# Patient Record
Sex: Female | Born: 1960 | Race: White | Hispanic: No | Smoking: Never smoker
Health system: Southern US, Community
[De-identification: ages and names within clinical notes are randomized; demographics above are authoritative.]

## PROBLEM LIST (undated history)

## (undated) DIAGNOSIS — T7840XA Allergy, unspecified, initial encounter: Secondary | ICD-10-CM

## (undated) DIAGNOSIS — G43909 Migraine, unspecified, not intractable, without status migrainosus: Secondary | ICD-10-CM

## (undated) DIAGNOSIS — M1990 Unspecified osteoarthritis, unspecified site: Secondary | ICD-10-CM

## (undated) DIAGNOSIS — E785 Hyperlipidemia, unspecified: Secondary | ICD-10-CM

## (undated) HISTORY — PX: NO PAST SURGERIES: SHX2092

## (undated) HISTORY — DX: Hyperlipidemia, unspecified: E78.5

## (undated) HISTORY — DX: Unspecified osteoarthritis, unspecified site: M19.90

## (undated) HISTORY — DX: Migraine, unspecified, not intractable, without status migrainosus: G43.909

## (undated) HISTORY — DX: Allergy, unspecified, initial encounter: T78.40XA

---

## 1988-10-31 HISTORY — PX: LAPAROSCOPIC ABDOMINAL EXPLORATION: SHX6249

## 1999-06-03 ENCOUNTER — Other Ambulatory Visit: Admission: RE | Admit: 1999-06-03 | Discharge: 1999-06-03 | Payer: Self-pay | Admitting: Gynecology

## 2000-09-04 ENCOUNTER — Other Ambulatory Visit: Admission: RE | Admit: 2000-09-04 | Discharge: 2000-09-04 | Payer: Self-pay | Admitting: Gynecology

## 2000-09-08 ENCOUNTER — Encounter: Admission: RE | Admit: 2000-09-08 | Discharge: 2000-09-08 | Payer: Self-pay | Admitting: Gynecology

## 2000-09-08 ENCOUNTER — Encounter: Payer: Self-pay | Admitting: Gynecology

## 2001-05-11 ENCOUNTER — Encounter: Payer: Self-pay | Admitting: Gynecology

## 2001-05-11 ENCOUNTER — Encounter: Admission: RE | Admit: 2001-05-11 | Discharge: 2001-05-11 | Payer: Self-pay | Admitting: Gynecology

## 2001-09-21 ENCOUNTER — Other Ambulatory Visit: Admission: RE | Admit: 2001-09-21 | Discharge: 2001-09-21 | Payer: Self-pay | Admitting: Gynecology

## 2002-06-04 ENCOUNTER — Encounter: Admission: RE | Admit: 2002-06-04 | Discharge: 2002-06-04 | Payer: Self-pay | Admitting: Gynecology

## 2002-06-04 ENCOUNTER — Encounter: Payer: Self-pay | Admitting: Gynecology

## 2002-09-23 ENCOUNTER — Other Ambulatory Visit: Admission: RE | Admit: 2002-09-23 | Discharge: 2002-09-23 | Payer: Self-pay | Admitting: Gynecology

## 2003-09-29 ENCOUNTER — Other Ambulatory Visit: Admission: RE | Admit: 2003-09-29 | Discharge: 2003-09-29 | Payer: Self-pay | Admitting: Gynecology

## 2004-10-15 ENCOUNTER — Other Ambulatory Visit: Admission: RE | Admit: 2004-10-15 | Discharge: 2004-10-15 | Payer: Self-pay | Admitting: Gynecology

## 2005-10-21 ENCOUNTER — Other Ambulatory Visit: Admission: RE | Admit: 2005-10-21 | Discharge: 2005-10-21 | Payer: Self-pay | Admitting: Gynecology

## 2006-11-10 ENCOUNTER — Other Ambulatory Visit: Admission: RE | Admit: 2006-11-10 | Discharge: 2006-11-10 | Payer: Self-pay | Admitting: Internal Medicine

## 2008-05-07 ENCOUNTER — Other Ambulatory Visit: Admission: RE | Admit: 2008-05-07 | Discharge: 2008-05-07 | Payer: Self-pay | Admitting: Internal Medicine

## 2008-05-12 ENCOUNTER — Ambulatory Visit (HOSPITAL_COMMUNITY): Admission: RE | Admit: 2008-05-12 | Discharge: 2008-05-12 | Payer: Self-pay | Admitting: Internal Medicine

## 2010-07-13 ENCOUNTER — Other Ambulatory Visit: Admission: RE | Admit: 2010-07-13 | Discharge: 2010-07-13 | Payer: Self-pay | Admitting: Internal Medicine

## 2010-09-09 ENCOUNTER — Encounter: Admission: RE | Admit: 2010-09-09 | Discharge: 2010-09-09 | Payer: Self-pay | Admitting: Internal Medicine

## 2010-11-21 ENCOUNTER — Encounter: Payer: Self-pay | Admitting: Internal Medicine

## 2012-08-20 ENCOUNTER — Other Ambulatory Visit: Payer: Self-pay | Admitting: Internal Medicine

## 2012-08-20 DIAGNOSIS — N6310 Unspecified lump in the right breast, unspecified quadrant: Secondary | ICD-10-CM

## 2012-08-23 ENCOUNTER — Ambulatory Visit
Admission: RE | Admit: 2012-08-23 | Discharge: 2012-08-23 | Disposition: A | Discharge status: Home / Self Care | Payer: 59 | Source: Ambulatory Visit | Attending: Internal Medicine | Admitting: Internal Medicine

## 2012-08-23 DIAGNOSIS — N6310 Unspecified lump in the right breast, unspecified quadrant: Secondary | ICD-10-CM

## 2013-08-06 ENCOUNTER — Other Ambulatory Visit: Payer: Self-pay

## 2013-08-06 DIAGNOSIS — Z1231 Encounter for screening mammogram for malignant neoplasm of breast: Secondary | ICD-10-CM

## 2013-08-26 ENCOUNTER — Ambulatory Visit
Admission: RE | Admit: 2013-08-26 | Discharge: 2013-08-26 | Disposition: A | Discharge status: Home / Self Care | Payer: 59 | Source: Ambulatory Visit

## 2013-08-26 DIAGNOSIS — Z1231 Encounter for screening mammogram for malignant neoplasm of breast: Secondary | ICD-10-CM

## 2013-08-28 ENCOUNTER — Other Ambulatory Visit: Payer: Self-pay | Admitting: Emergency Medicine

## 2013-08-28 ENCOUNTER — Other Ambulatory Visit (HOSPITAL_COMMUNITY)
Admission: RE | Admit: 2013-08-28 | Discharge: 2013-08-28 | Disposition: A | Discharge status: Home / Self Care | Payer: 59 | Source: Ambulatory Visit | Attending: Internal Medicine | Admitting: Internal Medicine

## 2013-08-28 DIAGNOSIS — Z01419 Encounter for gynecological examination (general) (routine) without abnormal findings: Secondary | ICD-10-CM | POA: Insufficient documentation

## 2013-09-04 ENCOUNTER — Telehealth: Payer: Self-pay | Admitting: Physician Assistant

## 2013-09-04 MED ORDER — METHYLPREDNISOLONE 4 MG PO KIT: PACK | ORAL | Status: DC

## 2013-09-04 NOTE — Telephone Encounter (Signed)
Pt hasnt heard anything about her oxygen order. Still having headaches. Wants to see if she can get a rx of prednisone to break the cycle.

## 2013-09-04 NOTE — Telephone Encounter (Signed)
Sent medrol dose pack to [facility]. Will send to Lupita Leash to contact [employer] to check on oxygen status.

## 2013-09-25 ENCOUNTER — Ambulatory Visit: Payer: Self-pay | Admitting: Physician Assistant

## 2013-10-03 ENCOUNTER — Telehealth: Payer: Self-pay

## 2013-10-03 ENCOUNTER — Telehealth: Payer: Self-pay | Admitting: Internal Medicine

## 2013-10-03 NOTE — Telephone Encounter (Signed)
Return call to patient advised her to d/c oxygen if not helping and per Marchelle Folks we can send her to a Neurologist or a Headache clinic , patient states that she has tried that and states that she heard botox might help, I advised her to call her insurance and see if it was covered and to advise where they would send patient for treatment

## 2013-10-03 NOTE — Telephone Encounter (Signed)
Pt said she used three tanks of oxygen for the migraines and it is not working. Please advise what to do now.

## 2013-10-03 NOTE — Telephone Encounter (Signed)
LMOM for patient regarding home sleep study result (ok to leave messages per patient registration form), results were normal

## 2013-10-10 ENCOUNTER — Encounter: Payer: Self-pay | Admitting: Internal Medicine

## 2013-10-10 DIAGNOSIS — E785 Hyperlipidemia, unspecified: Secondary | ICD-10-CM | POA: Insufficient documentation

## 2013-10-10 DIAGNOSIS — M1990 Unspecified osteoarthritis, unspecified site: Secondary | ICD-10-CM | POA: Insufficient documentation

## 2013-10-10 DIAGNOSIS — G43909 Migraine, unspecified, not intractable, without status migrainosus: Secondary | ICD-10-CM | POA: Insufficient documentation

## 2013-10-10 DIAGNOSIS — T7840XA Allergy, unspecified, initial encounter: Secondary | ICD-10-CM | POA: Insufficient documentation

## 2013-10-11 ENCOUNTER — Ambulatory Visit (INDEPENDENT_AMBULATORY_CARE_PROVIDER_SITE_OTHER): Payer: 59 | Admitting: Physician Assistant

## 2013-10-11 ENCOUNTER — Encounter: Payer: Self-pay | Admitting: Physician Assistant

## 2013-10-11 VITALS — BP 102/68 | HR 76 | Temp 98.1°F | Resp 16 | Ht 63.5 in | Wt 111.0 lb

## 2013-10-11 DIAGNOSIS — IMO0001 Reserved for inherently not codable concepts without codable children: Secondary | ICD-10-CM

## 2013-10-11 DIAGNOSIS — G43909 Migraine, unspecified, not intractable, without status migrainosus: Secondary | ICD-10-CM

## 2013-10-11 DIAGNOSIS — N3000 Acute cystitis without hematuria: Secondary | ICD-10-CM

## 2013-10-11 DIAGNOSIS — E785 Hyperlipidemia, unspecified: Secondary | ICD-10-CM

## 2013-10-11 DIAGNOSIS — M791 Myalgia, unspecified site: Secondary | ICD-10-CM

## 2013-10-11 DIAGNOSIS — E782 Mixed hyperlipidemia: Secondary | ICD-10-CM

## 2013-10-11 LAB — CK: Total CK: 99 U/L (ref 7–177)

## 2013-10-11 LAB — HEPATIC FUNCTION PANEL
ALT: 14 U/L (ref 0–35)
AST: 18 U/L (ref 0–37)
Bilirubin, Direct: 0.1 mg/dL (ref 0.0–0.3)
Indirect Bilirubin: 0.3 mg/dL (ref 0.0–0.9)

## 2013-10-11 LAB — LIPID PANEL
LDL Cholesterol: 94 mg/dL (ref 0–99)
VLDL: 20 mg/dL (ref 0–40)

## 2013-10-11 MED ORDER — METHAZOLAMIDE 50 MG PO TABS: 50.0000 mg | ORAL_TABLET | Freq: Three times a day (TID) | ORAL | Status: DC

## 2013-10-11 MED ORDER — CARBAMAZEPINE 100 MG PO CHEW: 100.0000 mg | CHEWABLE_TABLET | Freq: Two times a day (BID) | ORAL | Status: DC

## 2013-10-11 NOTE — Patient Instructions (Signed)
Migraine Headache A migraine headache is an intense, throbbing pain on one or both sides of your head. A migraine can last for 30 minutes to several hours. CAUSES  The exact cause of a migraine headache is not always known. However, a migraine may be caused when nerves in the brain become irritated and release chemicals that cause inflammation. This causes pain. SYMPTOMS  Pain on one or both sides of your head.  Pulsating or throbbing pain.  Severe pain that prevents daily activities.  Pain that is aggravated by any physical activity.  Nausea, vomiting, or both.  Dizziness.  Pain with exposure to bright lights, loud noises, or activity.  General sensitivity to bright lights, loud noises, or smells. Before you get a migraine, you may get warning signs that a migraine is coming (aura). An aura may include:  Seeing flashing lights.  Seeing bright spots, halos, or zig-zag lines.  Having tunnel vision or blurred vision.  Having feelings of numbness or tingling.  Having trouble talking.  Having muscle weakness. MIGRAINE TRIGGERS  Alcohol.  Smoking.  Stress.  Menstruation.  Aged cheeses.  Foods or drinks that contain nitrates, glutamate, aspartame, or tyramine.  Lack of sleep.  Chocolate.  Caffeine.  Hunger.  Physical exertion.  Fatigue.  Medicines used to treat chest pain (nitroglycerine), birth control pills, estrogen, and some blood pressure medicines. DIAGNOSIS  A migraine headache is often diagnosed based on:  Symptoms.  Physical examination.  A CT scan or MRI of your head. TREATMENT Medicines may be given for pain and nausea. Medicines can also be given to help prevent recurrent migraines.  HOME CARE INSTRUCTIONS  Only take over-the-counter or prescription medicines for pain or discomfort as directed by your caregiver. The use of long-term narcotics is not recommended.  Lie down in a dark, quiet room when you have a migraine.  Keep a journal  to find out what may trigger your migraine headaches. For example, write down:  What you eat and drink.  How much sleep you get.  Any change to your diet or medicines.  Limit alcohol consumption.  Quit smoking if you smoke.  Get 7 to 9 hours of sleep, or as recommended by your caregiver.  Limit stress.  Keep lights dim if bright lights bother you and make your migraines worse. SEEK IMMEDIATE MEDICAL CARE IF:   Your migraine becomes severe.  You have a fever.  You have a stiff neck.  You have vision loss.  You have muscular weakness or loss of muscle control.  You start losing your balance or have trouble walking.  You feel faint or pass out.  You have severe symptoms that are different from your first symptoms. MAKE SURE YOU:   Understand these instructions.  Will watch your condition.  Will get help right away if you are not doing well or get worse. Document Released: 10/17/2005 Document Revised: 01/09/2012 Document Reviewed: 10/07/2011 [employer] Patient Information 2014 [employer].  

## 2013-10-11 NOTE — Progress Notes (Signed)
HPI Patient presents for a one month follow up. She has been plaqued with migraines for a long time and have failed several medications and conservative treatment. Most recently we tried her on Oxygen for possible cluster headaches but this did not help. She had a negative sleep study. She continues to have pain. She has seen neurologist and the headache clinic, stating the only medication to help her has been Diamox which is no longer affective. Allergy testing showed she was allergic to cats, she is on claritin D.   Past Medical History  Diagnosis Date  . Allergy   . Hyperlipidemia   . Arthritis   . Migraines      No Known Allergies    Current Outpatient Prescriptions on File Prior to Visit  Medication Sig Dispense Refill  . atorvastatin (LIPITOR) 40 MG tablet Take 40 mg by mouth daily.      . frovatriptan (FROVA) 2.5 MG tablet Take 2.5 mg by mouth as needed for migraine (No more than 2 tablets in 24 hrs). If recurs, may repeat after 2 hours. Max of 3 tabs in 24 hours.      . methylPREDNISolone (MEDROL DOSEPAK) 4 MG tablet follow package directions  21 tablet  0   No current facility-administered medications on file prior to visit.    ROS: all negative expect above.   Physical: Filed Weights   10/11/13 0900  Weight: 111 lb (50.349 kg)   Filed Vitals:   10/11/13 0900  BP: 102/68  Pulse: 76  Temp: 98.1 F (36.7 C)  Resp: 16   General Appearance: Well nourished, in no apparent distress. Eyes: PERRLA, EOMs. Sinuses: No Frontal/maxillary tenderness ENT/Mouth: Ext aud canals clear, normal light reflex with TMs without erythema, bulging. Post pharynx without erythema, swelling, exudate.  Respiratory: CTAB Cardio: RRR, no murmurs, rubs or gallops. Peripheral pulses brisk and equal bilaterally, without edema. No aortic or femoral bruits. Abdomen: Flat, soft, with bowl sounds. Nontender, no guarding, rebound. Lymphatics: Non tender without lymphadenopathy.  Musculoskeletal: Full  ROM all peripheral extremities, 5/5 strength, and normal gait. Skin: Warm, dry without rashes, lesions, ecchymosis.  Neuro: Cranial nerves intact, reflexes equal bilaterally. Normal muscle tone, no cerebellar symptoms. Sensation intact.  Pysch: Awake and oriented X 3, normal affect, Insight and Judgment appropriate.   Assessment and Plan: Migraines-  Xyzal 5 for possible allergies Methazolamide 50mg  1-2 tablets daily for migraine.  Tegretol 100mg  BID   folow up in one month.

## 2013-10-12 LAB — URINALYSIS, ROUTINE W REFLEX MICROSCOPIC
Leukocytes, UA: NEGATIVE
Protein, ur: NEGATIVE mg/dL
Urobilinogen, UA: 0.2 mg/dL (ref 0.0–1.0)

## 2013-10-13 LAB — URINE CULTURE: Colony Count: 9000

## 2013-10-21 ENCOUNTER — Other Ambulatory Visit: Payer: Self-pay | Admitting: Physician Assistant

## 2013-10-21 MED ORDER — EZETIMIBE 10 MG PO TABS: 10.0000 mg | ORAL_TABLET | Freq: Every day | ORAL | Status: DC

## 2013-12-25 ENCOUNTER — Ambulatory Visit: Payer: Self-pay | Admitting: Physician Assistant

## 2013-12-31 ENCOUNTER — Ambulatory Visit (INDEPENDENT_AMBULATORY_CARE_PROVIDER_SITE_OTHER): Payer: 59 | Admitting: Physician Assistant

## 2013-12-31 ENCOUNTER — Encounter: Payer: Self-pay | Admitting: Physician Assistant

## 2013-12-31 VITALS — BP 102/62 | HR 76 | Temp 98.1°F | Resp 16 | Ht 63.5 in | Wt 111.0 lb

## 2013-12-31 DIAGNOSIS — G43909 Migraine, unspecified, not intractable, without status migrainosus: Secondary | ICD-10-CM

## 2013-12-31 DIAGNOSIS — J309 Allergic rhinitis, unspecified: Secondary | ICD-10-CM

## 2013-12-31 DIAGNOSIS — M7520 Bicipital tendinitis, unspecified shoulder: Secondary | ICD-10-CM

## 2013-12-31 DIAGNOSIS — J302 Other seasonal allergic rhinitis: Secondary | ICD-10-CM

## 2013-12-31 DIAGNOSIS — M7522 Bicipital tendinitis, left shoulder: Secondary | ICD-10-CM

## 2013-12-31 MED ORDER — FLUTICASONE PROPIONATE 50 MCG/ACT NA SUSP: 1.0000 | Freq: Every day | NASAL | Status: DC

## 2013-12-31 MED ORDER — PREDNISONE 20 MG PO TABS: ORAL_TABLET | ORAL | Status: DC

## 2013-12-31 NOTE — Patient Instructions (Signed)
Biceps Tendon Tendinitis (Proximal) and Tenosynovitis with Rehab Tendonitis and tenosynovitis involve inflammation of the tendon and the tendon lining (sheath). The proximal biceps tendon is vulnerable to tendonitis and tenosynovitis, which causes pain and discomfort in the front of the shoulder and upper arm. The tendon lining secretes a fluid that helps lubricate the tendon, allowing for proper function without pain. When the tendon and its lining become inflamed, the tendon can no longer glide smoothly, causing pain. The proximal biceps tendon connects the biceps muscle to two bones of the shoulder. It is important for proper function of the elbow and turning the palm upward (supination) using the wrist. Proximal biceps tendon tendinitis may include a grade 1 or 2 strain of the tendon. Grade 1 strains involve a slight pull of the tendon without signs of tearing and no observed tendon lengthening. There is also no loss of strength. Grade 2 strains involve small tears in the tendon fibers. The tendon or muscle is stretched and strength is usually decreased.  SYMPTOMS   Pain, tenderness, swelling, warmth, or redness over the front of the shoulder.  Pain that gets worse with shoulder and elbow use, especially against resistance.  Limited motion of the shoulder or elbow.  Crackling sound (crepitation) when the tendon or shoulder is moved or touched. CAUSES  The symptoms of biceps tendonitis are due to inflammation of the tendon. Inflammation may be caused by:  Strain from sudden increase in amount or intensity of activity.  Direct blow or injury to the elbow (uncommon).  Overuse or repetitive elbow bending or wrist rotation, particularly when turning the palm up, or with elbow hyperextension. RISK INCREASES WITH:  Sports that involve contact or overhead arm activity (throwing sports, gymnastics, weightlifting, bodybuilding, rock climbing).  Heavy labor.  Poor strength and  flexibility.  Failure to warm up properly before activity. PREVENTION  Warm up and stretch properly before activity.  Allow time for recovery between activities.  Maintain physical fitness:  Strength, flexibility, and endurance.  Cardiovascular fitness.  Learn and use proper exercise technique. PROGNOSIS  With proper treatment, proximal biceps tendon tendonitis and tenosynovitis is usually curable within 6 weeks. Healing is usually quicker if the cause was a direct blow, not overuse.  RELATED COMPLICATIONS   Longer healing time if not properly treated or if not given enough time to heal.  Chronically inflamed tendon that causes persistent pain with activity, that may progress to constant pain and potentially rupture of the tendon.  Recurring symptoms, especially if activity is resumed too soon or with overuse, a direct blow, or use of poor exercise technique. TREATMENT Treatment first involves ice and medicine, to reduce pain and inflammation. It is helpful to modify activities that cause pain, to reduce the chances of causing the condition to get worse. Strengthening and stretching exercises should be performed to promote proper use of the muscles of the shoulder. These exercises may be performed at home or with a therapist. Other treatments may be given such as ultrasound or heat therapy. A corticosteroid injection may be recommended to help reduce inflammation of the tendon lining. Surgery is usually not necessary. Sometimes, if symptoms last for greater than 6 months, surgery will be advised to detach the tendon and re-insert it into the arm bone. Surgery to correct other shoulder problems that may be contributing to tendinitis may be advised before surgery for the tendinitis itself.  MEDICATION  If pain medicine is needed, nonsteroidal anti-inflammatory medicines (aspirin and ibuprofen), or other minor pain relievers (  acetaminophen), are often advised.  Do not take pain medicine  for 7 days before surgery.  Prescription pain relievers may be given if your caregiver thinks they are needed. Use only as directed and only as much as you need.  Corticosteroid injections may be given. These injections should only be used on the most severe cases, as one can only receive a limited number of them. HEAT AND COLD   Cold treatment (icing) should be applied for 10 to 15 minutes every 2 to 3 hours for inflammation and pain, and immediately after activity that aggravates your symptoms. Use ice packs or an ice massage.  Heat treatment may be used before performing stretching and strengthening activities prescribed by your caregiver, physical therapist, or athletic trainer. Use a heat pack or a warm water soak. SEEK MEDICAL CARE IF:   Symptoms get worse or do not improve in 2 weeks, despite treatment.  New, unexplained symptoms develop. (Drugs used in treatment may produce side effects.) EXERCISES RANGE OF MOTION (ROM) AND EXERCISES - Biceps Tendon (Proximal) and Tenosynovitis These exercises may help you when beginning to rehabilitate your injury. Your symptoms may go away with or without further involvement from your physician, physical therapist, or athletic trainer. While completing these exercises, remember:   Restoring tissue flexibility helps normal motion to return to the joints. This allows healthier, less painful movement and activity.  An effective stretch should be held for at least 30 seconds.  A stretch should never be painful. You should only feel a gentle lengthening or release in the stretched tissue. STRETCH  Flexion, Standing  Stand with good posture. With an underhand grip on your right / left hand and an overhand grip on the opposite hand, grasp a broomstick or cane so that your hands are a little more than shoulder width apart.  Keeping your right / left elbow straight and shoulder muscles relaxed, push the stick with your opposite hand to raise your right /  left arm in front of your body and then overhead. Raise your arm until you feel a stretch in your right / left shoulder, but before you have increased shoulder pain.  Try to avoid shrugging your right / left shoulder as your arm rises, by keeping your shoulder blade tucked down and toward your mid-back spine. Hold for __________ seconds.  Slowly return to the starting position. Repeat __________ times. Complete this exercise __________ times per day. STRETCH  Abduction, Supine  Lie on your back. With an underhand grip on your right / left hand and an overhand grip on the opposite hand, grasp a broomstick or cane so that your hands are a little more than shoulder width apart.  Keeping your right / left elbow straight and shoulder muscles relaxed, push the stick with your opposite hand to raise your right / left arm out to the side of your body and then overhead. Raise your arm until you feel a stretch in your right / left shoulder, but before you have increased shoulder pain.  Try to avoid shrugging your right / left shoulder as your arm rises, by keeping your shoulder blade tucked down and toward your mid-back spine. Hold for __________ seconds.  Slowly return to the starting position. Repeat __________ times. Complete this exercise __________ times per day. ROM  Flexion, Active-Assisted  Lie on your back. You may bend your knees for comfort.  Grasp a broomstick or cane so your hands are about shoulder width apart. Your right / left hand should   grip the end of the stick so that your hand is positioned "thumbs-up," as if you were about to shake hands.  Using your healthy arm to lead, raise your right / left arm overhead until you feel a gentle stretch in your shoulder. Hold for __________ seconds.  Use the stick to assist in returning your right / left arm to its starting position. Repeat __________ times. Complete this exercise __________ times per day.  STRETCH  Flexion, Standing   Stand  facing a wall. Walk your right / left fingers up the wall until you feel a moderate stretch in your shoulder. As your hand gets higher, you may need to step closer to the wall or use a door frame to walk through.  Try to avoid shrugging your right / left shoulder as your arm rises, by keeping your shoulder blade tucked down and toward your mid-back spine.  Hold for __________ seconds. Use your other hand, if needed, to ease out of the stretch and return to the starting position. Repeat __________ times. Complete this exercise __________ times per day.  ROM - Internal Rotation   Using underhand grips, grasp a stick behind your back with both hands.  While standing upright with good posture, slide the stick up your back until you feel a mild stretch in the front of your shoulder.  Hold for __________ seconds. Slowly return to your starting position. Repeat __________ times. Complete this exercise __________ times per day.  STRETCH - Internal Rotation  Place your right / left hand behind your back, palm-up.  Throw a towel or belt over your opposite shoulder. Grasp the towel with your right / left hand.  While keeping an upright posture, gently pull up on the towel until you feel a stretch in the front of your right / left shoulder.  Avoid shrugging your right / left shoulder as your arm rises, by keeping your shoulder blade tucked down and toward your mid-back spine.  Hold for __________ seconds. Release the stretch by lowering your opposite hand. Repeat __________ times. Complete this exercise __________ times per day. STRENGTHENING EXERCISES - Biceps Tendon Tendinitis (Proximal) and Tenosynovitis These exercises may help you regain your strength after your physician has discontinued your restraint in a cast or brace. They may resolve your symptoms with or without further involvement from your physician, physical therapist or athletic trainer. While completing these exercises, remember:    Muscles can gain both the endurance and the strength needed for everyday activities through controlled exercises.  Complete these exercises as instructed by your physician, physical therapist or athletic trainer. Increase the resistance and repetitions only as guided.  You may experience muscle soreness or fatigue, but the pain or discomfort you are trying to eliminate should never worsen during these exercises. If this pain does get worse, stop and make sure you are following the directions exactly. If the pain is still present after adjustments, discontinue the exercise until you can discuss the trouble with your caregiver. STRENGTH - Elbow Flexors, Isometric  Stand or sit upright on a firm surface. Place your right / left arm so that your hand is palm-up and at the height of your waist.  Place your opposite hand on top of your forearm. Gently push down as your right / left arm resists. Push as hard as you can with both arms, without causing any pain or movement at your right / left elbow. Hold this stationary position for __________ seconds.  Gradually release the tension in both   arms. Allow your muscles to relax completely before repeating. Repeat __________ times. Complete this exercise __________ times per day. STRENGTH - Shoulder Flexion, Isometric  With good posture and facing a wall, stand or sit about 4-6 inches away.  Keeping your right / left elbow straight, gently press the top of your fist into the wall. Increase the pressure gradually until you are pressing as hard as you can, without shrugging your shoulder or increasing any shoulder discomfort.  Hold for __________ seconds.  Release the tension slowly. Relax your shoulder muscles completely before you start the next repetition. Repeat __________ times. Complete this exercise __________ times per day.  STRENGTH  Elbow Flexors, Supinated  With good posture, stand or sit on a firm chair without armrests. Allow your right /  left arm to rest at your side with your palm facing forward.  Holding a __________ weight, or gripping a rubber exercise band or tubing,  bring your hand toward your shoulder.  Allow your muscles to control the resistance as your hand returns to your side. Repeat __________ times. Complete this exercise __________ times per day.  STRENGTH - Shoulder Flexion  Stand or sit with good posture. Grasp a __________ weight, or an exercise band or tubing, so that your hand is "thumbs-up," like when you shake hands.  Slowly lift your right / left arm as far as you can, without increasing any shoulder pain. At first, many people can only raise their hand to shoulder height.  Avoid shrugging your right / left shoulder as your arm rises, by keeping your shoulder blade tucked down and toward your mid-back spine.  Hold for __________ seconds. Control the descent of your hand as you slowly return to your starting position. Repeat __________ times. Complete this exercise __________ times per day. Document Released: 10/17/2005 Document Revised: 01/09/2012 Document Reviewed: 01/29/2009 [employer] Patient Information 2014 [employer].  

## 2013-12-31 NOTE — Progress Notes (Signed)
Subjective:    Patient ID: Heidi Stone, female    DOB: 1961/10/02, 53 y.o.   MRN: [id number]  HPI 53 y.o. female presents for follow up of migraines. She has been plaqued with migraines since school age and was diagnosed in her 65's. She states they are usually throbbing pain in her eyes, denies aura, then very photosensitive, visual blurring,  And vomiting. 20 years ago Dr. Earley Favor at the headache clinic diagnosed her with barometric migraines and gave her Diamox which has helped up until 2-3 years ago. Within the last year she states they are now also occipital pain, very tender to touch and states it feels like a sinus headache. She has failed several medications and conservative treatment. Allergy testing only showed cat allergy, she has had a negative MRI 12/01/2011 by Dr. 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 Most recently we tried her on Oxygen for possible cluster headaches but this did not help. She had a negative sleep study. She has seen neurologist currently and the headache clinic 20 years ago.    At this point we have exhausted preventative medications such as CCB, BB, topamax, tegretol etc, she had migraines 13 days in Jan, and 8 in Feb and causes her to miss work and affects her quality of life. She is interested in Botox or noninvasive devices such as transcranial magnestic stimulator.    Complain of left shoulder pain for past month, has been lifting weights which is new. Worse with trying to put on a coat or lifting things, left front shoulder. No weakness/numbness tingling.   Cholesterol on Zetia and tolerating well.   Review of Systems  all negative expect above.    Past Medical History  Diagnosis Date  . Allergy   . Hyperlipidemia   . Arthritis   . Migraines      No Known Allergies    Current Outpatient Prescriptions on File Prior to Visit  Medication Sig Dispense Refill  . ezetimibe  (ZETIA) 10 MG tablet Take 1 tablet (10 mg total) by mouth daily.  30 tablet  11  . frovatriptan (FROVA) 2.5 MG tablet Take 2.5 mg by mouth as needed for migraine (No more than 2 tablets in 24 hrs). If recurs, may repeat after 2 hours. Max of 3 tabs in 24 hours.      . methazolamide (NEPTAZANE) 50 MG tablet Take 1 tablet (50 mg total) by mouth 3 (three) times daily.  60 tablet  1  . methylPREDNISolone (MEDROL DOSEPAK) 4 MG tablet follow package directions  21 tablet  0  . rizatriptan (MAXALT) 5 MG tablet Take 5 mg by mouth as needed for migraine. May repeat in 2 hours if needed       No current facility-administered medications on file prior to visit.      Objective:   Physical Exam  Blood pressure 102/62, pulse 76, temperature 98.1 F (36.7 C), resp. rate 16, height 5' 3.5" (1.613 m), weight 111 lb (50.349 kg).  General Appearance: Well nourished, in no apparent distress. Eyes: PERRLA, EOMs. Sinuses: No Frontal/maxillary tenderness ENT/Mouth: Ext aud canals clear, normal light reflex with TMs without erythema, bulging. Post pharynx without erythema, swelling, exudate.  Respiratory: CTAB Cardio: RRR, no murmurs, rubs or gallops. Peripheral pulses brisk and equal bilaterally, without edema. No aortic or femoral bruits. Abdomen: Flat, soft, with bowl sounds. Nontender, no guarding, rebound. Lymphatics: Non tender without lymphadenopathy.  Musculoskeletal: Full ROM all peripheral extremities except left shoulder, left shoulder with decreased internal rotation, external rotation and some pain with abduction. + bicep tendon tenderness.  Skin: Warm, dry without rashes, lesions, ecchymosis.  Neurologic exam:  Speech clear, pupils equal round reactive to light, extraocular movements intact  Normal peripheral visual fields  Cranial nerves III through XII normal including no facial droop  Follows commands, moves all extremities x4, normal strength to bilateral upper and lower extremities at all  major muscle groups including grip  Sensation normal to light touch and pinprick  Coordination intact, no limb ataxia, finger-nose-finger normal  Rapid alternating movements normal  No pronator drift  Gait normal Pysch: Awake and oriented X 3, normal affect, Insight and Judgment appropriate.      Assessment & Plan:  Migraines- debilitating, has failed conservative treatment and would like to try to not take medications, suggest try the headache clinic again.  Left shoulder bicep tendonitis without rupture- exercises and prednisone given.  Cholesterol- continue zetia  OVER 40 minutes of exam, counseling, chart review, referral performed

## 2014-01-09 ENCOUNTER — Telehealth: Payer: Self-pay | Admitting: Internal Medicine

## 2014-01-09 NOTE — Telephone Encounter (Signed)
DOROTHY FROM [facility] CALLED AND SAID THAT SHE CALLED THE PT TO SET UP APPT, AND THE PT SAID SHE WAS SUPPOSED TO BE GOING TO A [facility].  PLEASE ADVISE DOROTHY AT DR [facility] OFFICE AND PT OF CURRENT REFERRAL INFO. THANKS KATRINA

## 2014-04-24 ENCOUNTER — Encounter: Payer: Self-pay | Admitting: Physician Assistant

## 2014-04-24 ENCOUNTER — Telehealth: Payer: Self-pay

## 2014-04-24 MED ORDER — METHYLPREDNISOLONE 4 MG PO KIT: PACK | ORAL | Status: DC

## 2014-04-24 MED ORDER — RIZATRIPTAN BENZOATE 5 MG PO TABS: ORAL_TABLET | ORAL | Status: DC

## 2014-04-24 NOTE — Telephone Encounter (Signed)
Patient called c/o migraine for 4 days. Requesting Rx to break the migraine. Patient was seen at the [facility], but patient said that the doctor didn't help. Patient states she feels more comfortable having you treat her for migraines. [facility] prescribed Diclofenac 50mg . 1 tablet BID for 3 days. Please advise.

## 2014-07-16 ENCOUNTER — Telehealth: Payer: Self-pay

## 2014-07-16 NOTE — Telephone Encounter (Signed)
Patient called today complaining of being "stopped up", "dizzy" and congested, states heard her ears "pop" , has not tried anything OTC to help relieve symptoms, per Kelby Aline , PA called patient and advised her to try Sudafed OTC with OTC Flonase, drink plenty of water and if no relief or if symptoms get worse call office to schedule office visit to evaluate

## 2014-08-26 ENCOUNTER — Other Ambulatory Visit: Payer: Self-pay

## 2014-08-26 DIAGNOSIS — Z1231 Encounter for screening mammogram for malignant neoplasm of breast: Secondary | ICD-10-CM

## 2014-08-28 ENCOUNTER — Encounter: Payer: Self-pay | Admitting: Physician Assistant

## 2014-08-28 ENCOUNTER — Ambulatory Visit
Admission: RE | Admit: 2014-08-28 | Discharge: 2014-08-28 | Disposition: A | Discharge status: Home / Self Care | Payer: 59 | Source: Ambulatory Visit

## 2014-08-28 ENCOUNTER — Ambulatory Visit: Payer: 59 | Admitting: Physician Assistant

## 2014-08-28 VITALS — BP 100/62 | HR 76 | Temp 97.9°F | Resp 16 | Ht 63.5 in | Wt 112.0 lb

## 2014-08-28 DIAGNOSIS — Z23 Encounter for immunization: Secondary | ICD-10-CM

## 2014-08-28 DIAGNOSIS — Z1231 Encounter for screening mammogram for malignant neoplasm of breast: Secondary | ICD-10-CM

## 2014-08-28 DIAGNOSIS — Z0001 Encounter for general adult medical examination with abnormal findings: Secondary | ICD-10-CM

## 2014-08-28 DIAGNOSIS — M26609 Unspecified temporomandibular joint disorder, unspecified side: Secondary | ICD-10-CM

## 2014-08-28 DIAGNOSIS — G43809 Other migraine, not intractable, without status migrainosus: Secondary | ICD-10-CM

## 2014-08-28 MED ORDER — EZETIMIBE 10 MG PO TABS: 10.0000 mg | ORAL_TABLET | Freq: Every day | ORAL | Status: DC

## 2014-08-28 MED ORDER — NORETHIN ACE-ETH ESTRAD-FE 1-20 MG-MCG PO TABS: 1.0000 | ORAL_TABLET | Freq: Every day | ORAL | Status: DC

## 2014-08-28 MED ORDER — RIZATRIPTAN BENZOATE 5 MG PO TABS: ORAL_TABLET | ORAL | Status: DC

## 2014-08-28 MED ORDER — FROVATRIPTAN SUCCINATE 2.5 MG PO TABS: 2.5000 mg | ORAL_TABLET | ORAL | Status: DC | PRN

## 2014-08-28 MED ORDER — PREDNISONE 20 MG PO TABS
ORAL_TABLET | ORAL | Status: DC
Start: 2014-08-28 — End: 2014-11-17

## 2014-08-28 MED ORDER — BACLOFEN 10 MG PO TABS: ORAL_TABLET | ORAL | Status: AC

## 2014-08-28 MED ORDER — DICLOFENAC SODIUM 50 MG PO TBEC: 50.0000 mg | DELAYED_RELEASE_TABLET | Freq: Three times a day (TID) | ORAL | Status: DC

## 2014-08-28 MED ORDER — TRIAMCINOLONE ACETONIDE 0.1 % EX CREA: 1.0000 "application " | TOPICAL_CREAM | Freq: Two times a day (BID) | CUTANEOUS | Status: DC

## 2014-08-28 NOTE — Patient Instructions (Signed)
NO GUM  Try Baclofen 1/2-1 pill at night for TMJ What is the TMJ? The temporomandibular (tem-PUH-ro-man-DIB-yoo-ler) joint, or the TMJ, connects the upper and lower jawbones. This joint allows the jaw to open wide and move back and forth when you chew, talk, or yawn.There are also several muscles that help this joint move. There can be muscle tightness and pain in the muscle that can cause several symptoms.  What causes TMJ pain? There are many causes of TMJ pain. Repeated chewing (for example, chewing gum) and clenching your teeth can cause pain in the joint. Some TMJ pain has no obvious cause. What can I do to ease the pain? There are many things you can do to help your pain get better. When you have pain:  Eat soft foods and stay away from chewy foods (for example, taffy) Try to use both sides of your mouth to chew Don't chew gum Don't open your mouth wide (for example, during yawning or singing) Don't bite your cheeks or fingernails Lower your amount of stress and worry Applying a warm, damp washcloth to the joint may help. Over-the-counter pain medicines such as ibuprofen (one brand: Advil) or acetaminophen (one brand: Tylenol) might also help. Do not use these medicines if you are allergic to them or if your doctor told you not to use them. How can I stop the pain from coming back? When your pain is better, you can do these exercises to make your muscles stronger and to keep the pain from coming back:  Resisted mouth opening: Place your thumb or two fingers under your chin and open your mouth slowly, pushing up lightly on your chin with your thumb. Hold for three to six seconds. Close your mouth slowly. Resisted mouth closing: Place your thumbs under your chin and your two index fingers on the ridge between your mouth and the bottom of your chin. Push down lightly on your chin as you close your mouth. Tongue up: Slowly open and close your mouth while keeping the tongue touching the roof of  the mouth. Side-to-side jaw movement: Place an object about one fourth of an inch thick (for example, two tongue depressors) between your front teeth. Slowly move your jaw from side to side. Increase the thickness of the object as the exercise becomes easier Forward jaw movement: Place an object about one fourth of an inch thick between your front teeth and move the bottom jaw forward so that the bottom teeth are in front of the top teeth. Increase the thickness of the object as the exercise becomes easier. These exercises should not be painful. If it hurts to do these exercises, stop doing them and talk to your family doctor.   Can call Dr. Toy Cookey for TMJ symptoms. We will send notes. # [phone]

## 2014-08-28 NOTE — Progress Notes (Signed)
Complete Physical  Assessment and Plan: Allergic rhinitis- Allegra OTC, increase H20, allergy hygiene explained.  Hyperlipidemia--continue medications, check lipids, decrease fatty foods, increase activity.   Arthritis continue meds/exercise PRN  Migraines-continue medications  TMJ- information given to the patient, no gum/decrease hard foods, warm wet wash clothes, decrease stress, has night guard, needs to wear NIGHTLY, stop gum, can do massage, and exercise.  Colonoscopy- patient declines a colonoscopy even though the risks and benefits were discussed at length. Colon cancer is 3rd most diagnosed cancer and 2nd leading cause of death in both men and women 53 years of age and older. She will try to find someone to drive her and will call the office for referral MGM today PAP 2 years Health Maintenance   Discussed med's effects and SE's. Screening labs and tests as requested with regular follow-up as recommended.  HPI  53 y.o. female  presents for a complete physical.  Her blood pressure has been controlled at home, today their BP is BP: 100/62 mmHg She does workout. She denies chest pain, shortness of breath, dizziness.  She is on cholesterol medication, zetia every other day and denies myalgias. Her cholesterol is at goal. The cholesterol last visit was:  07/2013 (166) Lab Results  Component Value Date   CHOL 164 10/11/2013   HDL 50 10/11/2013   LDLCALC 94 10/11/2013   TRIG 98 10/11/2013   CHOLHDL 3.3 10/11/2013    Last A1C in the office was: 5.6.  Patient is on Vitamin D supplement, she is on 2000 IU daily, last one was 47.  She has a history of migraines, she has been to Dr. Melton Alar and the headache clinic without help. She states with the rain/weather her migraines are worse.  She has allergies and uses flonase.   Current Medications:  Current Outpatient Prescriptions on File Prior to Visit  Medication Sig Dispense Refill  . ezetimibe (ZETIA) 10 MG tablet Take 1 tablet (10  mg total) by mouth daily.  30 tablet  11  . fluticasone (FLONASE) 50 MCG/ACT nasal spray Place 1 spray into both nostrils daily.  16 g  2  . frovatriptan (FROVA) 2.5 MG tablet Take 2.5 mg by mouth as needed for migraine (No more than 2 tablets in 24 hrs). If recurs, may repeat after 2 hours. Max of 3 tabs in 24 hours.      . rizatriptan (MAXALT) 5 MG tablet Take one tablet for migraine headache, May repeat in 2 hours if needed  10 tablet  0   No current facility-administered medications on file prior to visit.   Health Maintenance:   Immunization History  Administered Date(s) Administered  . Influenza Split 08/28/2013  . Td 07/12/2010   Tetanus:2011 Pneumovax: Flu vaccine: 2015 Zostavax: Pap: 07/2013 never abnormal pap due 2017 MGM: 07/2013 getting today  DEXA: no family history, on estrogen, Vitamin D, will add Ca+ Colonoscopy: Never very encouarged EGD: Last Dental Exam: Dr. Estill Bamberg [place] Last Eye Exam: Dr. Marica Otter, last week  Patient Care Team: Unk Pinto, MD as PCP - General (Internal Medicine)  Allergies: No Known Allergies Medical History:  Past Medical History  Diagnosis Date  . Allergy   . Hyperlipidemia   . Arthritis   . Migraines    Surgical History: No past surgical history on file. Family History:  Family History  Problem Relation Age of Onset  . Hyperlipidemia Mother   . Alzheimer's disease Mother   . Hypertension Father   . Hyperlipidemia Father  Social History:  History  Substance Use Topics  . Smoking status: Never Smoker   . Smokeless tobacco: Never Used  . Alcohol Use: No     Review of Systems: [X]  = complains of  [ ]  = denies  General: Fatigue [ ]  Fever [ ]  Chills [ ]  Weakness [ ]   Insomnia [ ] Weight change [ ]  Night sweats [ ]   Change in appetite [ ]  Head: Head Trauma [ ]  Eyes: Wears glasses or corrective lens [ ]  Redness [ ]  Blurred vision [ ]  Diplopia [ ]  Discharge [ ]  Floaters [ ]  WNU:UVOZDGU [ ]  hearing loss [ ]   Tinnitus [ ]  Ear Discharge [ ]   Congestion [ ]  Sinus Pain [ ]  Post Nasal Drip [ ]  Nose Bleeds [ ]  Rhinorrhea [ ]    Difficulty Swallowing [ ]  Snoring [ ]  Sore Throat [ ]  Cardiac:   Chest pain/pressure [ ]  SOB [ ]  Orthopnea [ ]   Palpitations [ ]   Paroxysmal nocturnal dyspnea[ ]  Claudication [ ]  Edema [ ]  Difficulty walking around block or climbing stairs [ ]  Pulmonary: Cough [ ]  Wheezing[ ]   SOB [ ]   Pleurisy [ ]  Asthma [ ]  GI: Nausea [ ]  Vomiting[ ]  Dysphagia[ ]  Heartburn[ ]  Abdominal pain [ ]  Constipation [ ] ; Diarrhea [ ]  BRBPR [ ]  Melena[ ]  Bloating [ ]  Hemorrhoids [ ]  Incontinence [ ]  GU: Hematuria[ ]  Dysuria [ ]  Nocturia[ ]  Urgency [ ]   Hesitancy [ ]  Discharge [ ]  Frequency [ ]  Incontinence [ ]  Breast:  Dimpling [ ]  Breast lumps [ ]   Breast Lesions [ ]  Nipple discharge [ ]    Neuro: Headaches[x ] Vertigo[ ]  Paresthesias[ ]  Spasm [ ]  Speech changes [ ]  Incoordination [ ]  Dizziness [ ]  Numbness [ ]  Ortho: Arthritis [ ]  Joint pain [ ]  Muscle pain [ ]  Joint swelling [ ]  Back Pain [ ]  Weakness [ ]  Stiffness [ ]  Skin:  Rash [ ]   Pruritis [ ]  Change in skin lesion [ ]  Change in hair [ ]  Change in nails [ ]  Psych: Depression[ ]  Anxiety[ ]  Stress [ ]  Confusion [ ]  Memory loss [ ]   Heme/Lymph: Bleeding [ ]  Bruising [ ]  History of anemia [ ]  Enlarged lymph nodes [ ]   Endocrine: Visual blurring [ ]  Paresthesia [ ]  Polyuria [ ]  Polydipsia [ ]  Polyphagia [ ]   Heat/cold intolerance [ ]  Hypoglycemia [ ]  Thyroid Issues [ ]  Diabetes [ ]   Physical Exam: Estimated body mass index is 19.53 kg/(m^2) as calculated from the following:   Height as of this encounter: 5' 3.5" (1.613 m).   Weight as of this encounter: 112 lb (50.803 kg). BP 100/62  Pulse 76  Temp(Src) 97.9 F (36.6 C)  Resp 16  Ht 5' 3.5" (1.613 m)  Wt 112 lb (50.803 kg)  BMI 19.53 kg/m2 General Appearance: Well nourished, in no apparent distress.  Eyes: PERRLA, EOMs, conjunctiva no swelling or erythema, normal fundi and vessels.  Sinuses: No  Frontal/maxillary tenderness  ENT/Mouth: Ext aud canals clear, normal light reflex with TMs without erythema, bulging. Good dentition. No erythema, swelling, or exudate on post pharynx. Tonsils not swollen or erythematous. Hearing normal. + TMJ Neck: Supple, thyroid normal. No bruits  Respiratory: Respiratory effort normal, BS equal bilaterally without rales, rhonchi, wheezing or stridor.  Cardio: RRR without murmurs, rubs or gallops. Brisk peripheral pulses without edema.  Chest: symmetric, with normal excursions and percussion.  Breasts: Symmetric, without lumps, nipple discharge, retractions.  Abdomen:  Soft, nontender, no guarding, rebound, hernias, masses, or organomegaly. .  Lymphatics: Non tender without lymphadenopathy.  Genitourinary: defer Musculoskeletal: Full ROM all peripheral extremities,5/5 strength, and normal gait.  Skin: Warm, dry without rashes, lesions, ecchymosis. Neuro: Cranial nerves intact, reflexes equal bilaterally. Normal muscle tone, no cerebellar symptoms. Sensation intact.  Psych: Awake and oriented X 3, normal affect, Insight and Judgment appropriate.   EKG: WNL no changes.   Vicie Mutters 9:17 AM [facility]

## 2014-09-04 ENCOUNTER — Other Ambulatory Visit: Payer: Self-pay | Admitting: Physician Assistant

## 2014-09-05 LAB — BASIC METABOLIC PANEL
BUN/Creatinine Ratio: 15 (ref 9–23)
BUN: 13 mg/dL (ref 6–24)
CO2: 23 mmol/L (ref 18–29)
Calcium: 9.1 mg/dL (ref 8.7–10.2)
Chloride: 104 mmol/L (ref 97–108)
Creatinine, Ser: 0.88 mg/dL (ref 0.57–1.00)
GFR, EST AFRICAN AMERICAN: 86 mL/min/{1.73_m2} (ref >59)
GFR, EST NON AFRICAN AMERICAN: 74 mL/min/{1.73_m2} (ref >59)
Glucose: 97 mg/dL (ref 65–99)
Potassium: 4 mmol/L (ref 3.5–5.2)
SODIUM: 145 mmol/L — AB (ref 134–144)

## 2014-09-05 LAB — CBC WITH DIFFERENTIAL
BASOS ABS: 0.1 10*3/uL (ref 0.0–0.2)
Basos: 1 %
EOS ABS: 0.2 10*3/uL (ref 0.0–0.4)
Eos: 3 %
HCT: 38.6 % (ref 34.0–46.6)
Hemoglobin: 12.6 g/dL (ref 11.1–15.9)
Immature Grans (Abs): 0 10*3/uL (ref 0.0–0.1)
Immature Granulocytes: 0 %
LYMPHS ABS: 2.5 10*3/uL (ref 0.7–3.1)
LYMPHS: 44 %
MCH: 29.2 pg (ref 26.6–33.0)
MCHC: 32.6 g/dL (ref 31.5–35.7)
MCV: 89 fL (ref 79–97)
MONOCYTES: 8 %
Monocytes Absolute: 0.4 10*3/uL (ref 0.1–0.9)
NEUTROS ABS: 2.5 10*3/uL (ref 1.4–7.0)
Neutrophils Relative %: 44 %
Platelets: 279 10*3/uL (ref 150–379)
RBC: 4.32 x10E6/uL (ref 3.77–5.28)
RDW: 12.9 % (ref 12.3–15.4)
WBC: 5.6 10*3/uL (ref 3.4–10.8)

## 2014-09-05 LAB — MICROSCOPIC EXAMINATION: Casts: NONE SEEN /lpf

## 2014-09-05 LAB — HGB A1C W/O EAG: HEMOGLOBIN A1C: 5.7 % — AB (ref 4.8–5.6)

## 2014-09-05 LAB — LIPID PANEL W/O CHOL/HDL RATIO
Cholesterol, Total: 194 mg/dL (ref 100–199)
HDL: 45 mg/dL (ref >39)
LDL CALC: 129 mg/dL — AB (ref 0–99)
TRIGLYCERIDES: 100 mg/dL (ref 0–149)
VLDL CHOLESTEROL CAL: 20 mg/dL (ref 5–40)

## 2014-09-05 LAB — MICROALBUMIN, URINE: MICROALBUM., U, RANDOM: 9.1 ug/mL (ref 0.0–17.0)

## 2014-09-05 LAB — URINALYSIS, COMPLETE
Bilirubin, UA: NEGATIVE
Glucose, UA: NEGATIVE
Ketones, UA: NEGATIVE
NITRITE UA: NEGATIVE
PH UA: 6 (ref 5.0–7.5)
PROTEIN UA: NEGATIVE
RBC UA: NEGATIVE
SPEC GRAV UA: 1.02 (ref 1.005–1.030)
Urobilinogen, Ur: 0.2 mg/dL (ref 0.2–1.0)

## 2014-09-05 LAB — VITAMIN D 25 HYDROXY (VIT D DEFICIENCY, FRACTURES): VIT D 25 HYDROXY: 47.4 ng/mL (ref 30.0–100.0)

## 2014-09-05 LAB — HEPATIC FUNCTION PANEL
ALBUMIN: 4.3 g/dL (ref 3.5–5.5)
ALK PHOS: 76 IU/L (ref 39–117)
ALT: 23 IU/L (ref 0–32)
AST: 23 IU/L (ref 0–40)
Bilirubin, Direct: 0.07 mg/dL (ref 0.00–0.40)
TOTAL PROTEIN: 6.6 g/dL (ref 6.0–8.5)
Total Bilirubin: 0.2 mg/dL (ref 0.0–1.2)

## 2014-09-05 LAB — FERRITIN: FERRITIN: 57 ng/mL (ref 15–150)

## 2014-09-05 LAB — IRON: IRON: 78 ug/dL (ref 35–155)

## 2014-09-05 LAB — MAGNESIUM: Magnesium: 2.1 mg/dL (ref 1.6–2.6)

## 2014-09-05 LAB — INSULIN, FASTING: INSULIN: 19.5 u[IU]/mL (ref 2.6–24.9)

## 2014-11-17 ENCOUNTER — Telehealth (INDEPENDENT_AMBULATORY_CARE_PROVIDER_SITE_OTHER): Payer: 59 | Admitting: Physician Assistant

## 2014-11-17 ENCOUNTER — Telehealth: Payer: Self-pay

## 2014-11-17 DIAGNOSIS — I10 Essential (primary) hypertension: Secondary | ICD-10-CM

## 2014-11-17 DIAGNOSIS — J0100 Acute maxillary sinusitis, unspecified: Secondary | ICD-10-CM

## 2014-11-17 MED ORDER — PREDNISONE 20 MG PO TABS: ORAL_TABLET | ORAL | Status: DC

## 2014-11-17 MED ORDER — AZITHROMYCIN 250 MG PO TABS: ORAL_TABLET | ORAL | Status: DC

## 2014-11-17 NOTE — Telephone Encounter (Signed)
lmom per Estill Bamberg , advised patient that zpak and prednisone were sent to pharmacy. Please try prednisone for another 2-3 days and if no better get the zpak. Can try Mucinex, OTC flonase, allergy pill. If no better within 7 days will need to follow up in office.

## 2014-11-17 NOTE — Progress Notes (Signed)
Patient calls and complains  of possible sinusitis. Symptoms include congestion, no  fever, sinus pressure, sneezing and sore throat. Onset of symptoms was 1 week ago, and has been gradually worsening since that time. Treatment to date: decongestants and Advil without relief. URI- Discussed the diagnosis and treatment of sinusitis. Discussed the importance of avoiding unnecessary antibiotic therapy. Suggested symptomatic OTC remedies. Nasal saline spray for congestion. Zithromax per orders. Nasal steroids per orders. Follow up as needed. Follow up in 5 days or as needed.

## 2014-12-10 ENCOUNTER — Encounter: Payer: Self-pay | Admitting: Physician Assistant

## 2014-12-10 ENCOUNTER — Other Ambulatory Visit: Payer: Self-pay | Admitting: Physician Assistant

## 2014-12-10 DIAGNOSIS — J0100 Acute maxillary sinusitis, unspecified: Secondary | ICD-10-CM

## 2014-12-10 MED ORDER — PREDNISONE 20 MG PO TABS: ORAL_TABLET | ORAL | Status: DC

## 2015-02-26 ENCOUNTER — Other Ambulatory Visit: Payer: Self-pay | Admitting: Physician Assistant

## 2015-05-06 ENCOUNTER — Other Ambulatory Visit: Payer: Self-pay | Admitting: Physician Assistant

## 2015-05-18 ENCOUNTER — Other Ambulatory Visit: Payer: Self-pay | Admitting: Physician Assistant

## 2015-05-19 ENCOUNTER — Other Ambulatory Visit: Payer: Self-pay | Admitting: Physician Assistant

## 2015-05-20 ENCOUNTER — Other Ambulatory Visit: Payer: Self-pay | Admitting: Physician Assistant

## 2015-07-29 ENCOUNTER — Ambulatory Visit (INDEPENDENT_AMBULATORY_CARE_PROVIDER_SITE_OTHER): Payer: 59 | Admitting: *Deleted

## 2015-07-29 DIAGNOSIS — Z23 Encounter for immunization: Secondary | ICD-10-CM | POA: Diagnosis not present

## 2015-08-27 ENCOUNTER — Other Ambulatory Visit: Payer: Self-pay | Admitting: Physician Assistant

## 2015-08-31 ENCOUNTER — Encounter: Payer: Self-pay | Admitting: Physician Assistant

## 2015-08-31 ENCOUNTER — Encounter: Payer: Self-pay | Admitting: Internal Medicine

## 2015-08-31 ENCOUNTER — Ambulatory Visit (INDEPENDENT_AMBULATORY_CARE_PROVIDER_SITE_OTHER): Payer: 59 | Admitting: Internal Medicine

## 2015-08-31 VITALS — BP 140/82 | HR 74 | Temp 98.0°F | Resp 16 | Ht 64.0 in | Wt 110.0 lb

## 2015-08-31 DIAGNOSIS — G43909 Migraine, unspecified, not intractable, without status migrainosus: Secondary | ICD-10-CM

## 2015-08-31 DIAGNOSIS — Z136 Encounter for screening for cardiovascular disorders: Secondary | ICD-10-CM

## 2015-08-31 DIAGNOSIS — J0100 Acute maxillary sinusitis, unspecified: Secondary | ICD-10-CM

## 2015-08-31 DIAGNOSIS — Z0000 Encounter for general adult medical examination without abnormal findings: Secondary | ICD-10-CM | POA: Diagnosis not present

## 2015-08-31 DIAGNOSIS — T7840XD Allergy, unspecified, subsequent encounter: Secondary | ICD-10-CM

## 2015-08-31 DIAGNOSIS — Z131 Encounter for screening for diabetes mellitus: Secondary | ICD-10-CM

## 2015-08-31 DIAGNOSIS — Z130 Encounter for screening for diseases of the blood and blood-forming organs and certain disorders involving the immune mechanism: Secondary | ICD-10-CM

## 2015-08-31 DIAGNOSIS — M1990 Unspecified osteoarthritis, unspecified site: Secondary | ICD-10-CM

## 2015-08-31 DIAGNOSIS — Z1212 Encounter for screening for malignant neoplasm of rectum: Secondary | ICD-10-CM

## 2015-08-31 DIAGNOSIS — E785 Hyperlipidemia, unspecified: Secondary | ICD-10-CM

## 2015-08-31 MED ORDER — CIPROFLOXACIN HCL 500 MG PO TABS
500.0000 mg | ORAL_TABLET | Freq: Two times a day (BID) | ORAL | Status: AC
Start: 2015-08-31 — End: 2015-09-03

## 2015-08-31 MED ORDER — NORETHIN ACE-ETH ESTRAD-FE 1-20 MG-MCG PO TABS: 1.0000 | ORAL_TABLET | Freq: Every day | ORAL | Status: DC

## 2015-08-31 MED ORDER — RIZATRIPTAN BENZOATE 10 MG PO TBDP: ORAL_TABLET | ORAL | Status: DC

## 2015-08-31 MED ORDER — AZITHROMYCIN 250 MG PO TABS: ORAL_TABLET | ORAL | Status: DC

## 2015-08-31 MED ORDER — EZETIMIBE 10 MG PO TABS: 10.0000 mg | ORAL_TABLET | Freq: Every day | ORAL | Status: DC

## 2015-08-31 MED ORDER — PREDNISONE 20 MG PO TABS: ORAL_TABLET | ORAL | Status: DC

## 2015-08-31 NOTE — Progress Notes (Signed)
Patient ID: Heidi Stone, female   DOB: 24-Mar-1961, 54 y.o.   MRN: [id number]    Annual Screening Comprehensive Examination   This very nice 54 y.o.female presents for complete physical.  Patient has hyperlipidemia controlled by diet and exercise.  Patient does reports that she has had a week of upper respiratory system infection.    Finally, patient has history of Vitamin D Deficiency and last vitamin D was  Lab Results  Component Value Date   VD25OH 47.4 09/04/2014  .  Currently on supplementation  She does tend to have seasonal allergies.  They have been under okay control.   She is up to date on her flu and TDAP vaccine.    She has never had a colonoscopy.      Current Outpatient Prescriptions on File Prior to Visit  Medication Sig Dispense Refill  . BEPREVE 1.5 % SOLN PUT 1 DROP INTO BOTH EYES TWICE A DAY 5 mL 1  . ezetimibe (ZETIA) 10 MG tablet Take 1 tablet (10 mg total) by mouth daily. 30 tablet 11  . fluticasone (FLONASE) 50 MCG/ACT nasal spray Place 1 spray into both nostrils daily. 16 g 2  . frovatriptan (FROVA) 2.5 MG tablet Take 1 tablet (2.5 mg total) by mouth as needed for migraine (No more than 2 tablets in 24 hrs). 30 tablet 3  . ibuprofen (ADVIL,MOTRIN) 800 MG tablet TAKE 1 TABLET THREE TIMES A DAY WITH FOOD AS NEEDED 90 tablet 0  . norethindrone-ethinyl estradiol (LOESTRIN FE 1/20) 1-20 MG-MCG tablet Take 1 tablet by mouth daily. 3 Package 4  . rizatriptan (MAXALT-MLT) 10 MG disintegrating tablet DISSOLVE 1 TABLET UNDER TONGUE AS NEEDED FOR MIGRAINE 90 tablet 0   No current facility-administered medications on file prior to visit.    No Known Allergies  Past Medical History  Diagnosis Date  . Allergy   . Hyperlipidemia   . Arthritis   . Migraines     Immunization History  Administered Date(s) Administered  . Influenza Split 08/28/2013, 08/28/2014, 07/29/2015  . Td 07/12/2010  . Tdap 07/29/2015    No past surgical history on file.  Family  History  Problem Relation Age of Onset  . Hyperlipidemia Mother   . Hypertension Mother   . Hyperlipidemia Father     Social History   Social History  . Marital Status: Married    Spouse Name: N/A  . Number of Children: N/A  . Years of Education: N/A   Occupational History  . Not on file.   Social History Main Topics  . Smoking status: Never Smoker   . Smokeless tobacco: Never Used  . Alcohol Use: No  . Drug Use: No  . Sexual Activity: Not on file   Other Topics Concern  . Not on file   Social History Narrative   Review of Systems  Constitutional: Negative for fever, chills and malaise/fatigue.  HENT: Positive for congestion. Negative for ear pain and sore throat.   Eyes: Negative.   Respiratory: Negative for cough, shortness of breath and wheezing.   Cardiovascular: Negative for chest pain, palpitations and leg swelling.  Gastrointestinal: Negative for heartburn, diarrhea, constipation, blood in stool and melena.  Genitourinary: Negative.   Neurological: Positive for headaches. Negative for dizziness, sensory change and loss of consciousness.  Psychiatric/Behavioral: Negative for depression. The patient is not nervous/anxious and does not have insomnia.       Physical Exam  BP 140/82 mmHg  Pulse 74  Temp(Src) 98 F (36.7 C) (Temporal)  Resp 16  Ht 5\' 4"  (1.626 m)  Wt 110 lb (49.896 kg)  BMI 18.87 kg/m2  General Appearance: Well nourished and in no apparent distress. Eyes: PERRLA, EOMs, conjunctiva no swelling or erythema, normal fundi and vessels. Sinuses: No frontal/maxillary tenderness ENT/Mouth: EACs patent / TMs  nl. Nares clear without erythema, swelling, mucoid exudates. Oral hygiene is good. No erythema, swelling, or exudate. Tongue normal, non-obstructing. Tonsils not swollen or erythematous. Hearing normal.  Neck: Supple, thyroid normal. No bruits, nodes or JVD. Respiratory: Respiratory effort normal.  BS equal and clear bilateral without rales,  rhonci, wheezing or stridor. Cardio: Heart sounds are normal with regular rate and rhythm and no murmurs, rubs or gallops. Peripheral pulses are normal and equal bilaterally without edema. No aortic or femoral bruits. Chest: symmetric with normal excursions and percussion. Breasts: Symmetric, without lumps, nipple discharge, retractions, or fibrocystic changes.  Abdomen: Flat, soft, with bowl sounds. Nontender, no guarding, rebound, hernias, masses, or organomegaly.  Lymphatics: Non tender without lymphadenopathy.  Genitourinary:  Musculoskeletal: Full ROM all peripheral extremities, joint stability, 5/5 strength, and normal gait. Skin: Warm and dry without rashes, lesions, cyanosis, clubbing or  ecchymosis.  Neuro: Cranial nerves intact, reflexes equal bilaterally. Normal muscle tone, no cerebellar symptoms. Sensation intact.  Pysch: Awake and oriented X 3, normal affect, Insight and Judgment appropriate.   Assessment and Plan   1. Migraine without status migrainosus, not intractable, unspecified migraine type -cont meds -currently well controlled  2. Arthritis -recommended tumeric  3. Allergy, subsequent encounter -cont allergy medications  4. Hyperlipidemia -check level -cont zetia  5. Screening for diabetes mellitus -A1C  6. Screening for deficiency anemia -Iron TIBC  7. Screening for rectal cancer -referral to Dr. Oletta Lamas - Ambulatory referral to Gastroenterology  8. Acute maxillary sinusitis, recurrence not specified [J01.00] -prednisone - azithromycin (ZITHROMAX) 250 MG tablet; 2 tablets by mouth today then one tablet daily for 4 days.  Dispense: 6 tablet; Refill: 1  EKG and Aortascan nromal    Continue prudent diet as discussed, weight control, regular exercise, and medications. Routine screening labs and tests as requested with regular follow-up as recommended.  Over 40 minutes of exam, counseling, chart review and critical decision making was performed

## 2015-08-31 NOTE — Patient Instructions (Signed)

## 2015-08-31 NOTE — Addendum Note (Signed)
Addended by: Starlyn Skeans A on: 08/31/2015 05:30 PM   Modules accepted: Orders, SmartSet

## 2015-09-03 ENCOUNTER — Other Ambulatory Visit: Payer: Self-pay | Admitting: Internal Medicine

## 2015-09-04 LAB — LIPID PANEL W/O CHOL/HDL RATIO
Cholesterol, Total: 215 mg/dL — ABNORMAL HIGH (ref 100–199)
HDL: 49 mg/dL (ref >39)
LDL Calculated: 149 mg/dL — ABNORMAL HIGH (ref 0–99)
Triglycerides: 87 mg/dL (ref 0–149)
VLDL CHOLESTEROL CAL: 17 mg/dL (ref 5–40)

## 2015-09-04 LAB — MICROSCOPIC EXAMINATION

## 2015-09-04 LAB — COMPREHENSIVE METABOLIC PANEL
A/G RATIO: 1.8 (ref 1.1–2.5)
ALBUMIN: 4.1 g/dL (ref 3.5–5.5)
ALT: 35 IU/L — ABNORMAL HIGH (ref 0–32)
AST: 26 IU/L (ref 0–40)
Alkaline Phosphatase: 81 IU/L (ref 39–117)
BILIRUBIN TOTAL: 0.2 mg/dL (ref 0.0–1.2)
BUN / CREAT RATIO: 23 (ref 9–23)
BUN: 17 mg/dL (ref 6–24)
CHLORIDE: 100 mmol/L (ref 97–106)
CO2: 23 mmol/L (ref 18–29)
Calcium: 9.4 mg/dL (ref 8.7–10.2)
Creatinine, Ser: 0.75 mg/dL (ref 0.57–1.00)
GFR calc non Af Amer: 89 mL/min/{1.73_m2} (ref >59)
GFR, EST AFRICAN AMERICAN: 103 mL/min/{1.73_m2} (ref >59)
Globulin, Total: 2.3 g/dL (ref 1.5–4.5)
Glucose: 113 mg/dL — ABNORMAL HIGH (ref 65–99)
POTASSIUM: 4.4 mmol/L (ref 3.5–5.2)
SODIUM: 139 mmol/L (ref 136–144)
TOTAL PROTEIN: 6.4 g/dL (ref 6.0–8.5)

## 2015-09-04 LAB — URINALYSIS, COMPLETE
Bilirubin, UA: NEGATIVE
GLUCOSE, UA: NEGATIVE
Ketones, UA: NEGATIVE
Leukocytes, UA: NEGATIVE
Nitrite, UA: NEGATIVE
PH UA: 6.5 (ref 5.0–7.5)
RBC, UA: NEGATIVE
Specific Gravity, UA: 1.024 (ref 1.005–1.030)
Urobilinogen, Ur: 0.2 mg/dL (ref 0.2–1.0)

## 2015-09-04 LAB — CBC WITH DIFFERENTIAL/PLATELET
BASOS: 0 %
Basophils Absolute: 0 10*3/uL (ref 0.0–0.2)
EOS (ABSOLUTE): 0 10*3/uL (ref 0.0–0.4)
Eos: 0 %
Hematocrit: 37.2 % (ref 34.0–46.6)
Hemoglobin: 12.4 g/dL (ref 11.1–15.9)
IMMATURE GRANS (ABS): 0 10*3/uL (ref 0.0–0.1)
Immature Granulocytes: 0 %
LYMPHS: 7 %
Lymphocytes Absolute: 0.8 10*3/uL (ref 0.7–3.1)
MCH: 30 pg (ref 26.6–33.0)
MCHC: 33.3 g/dL (ref 31.5–35.7)
MCV: 90 fL (ref 79–97)
Monocytes Absolute: 0.4 10*3/uL (ref 0.1–0.9)
Monocytes: 3 %
NEUTROS ABS: 10.1 10*3/uL — AB (ref 1.4–7.0)
Neutrophils: 90 %
PLATELETS: 401 10*3/uL — AB (ref 150–379)
RBC: 4.14 x10E6/uL (ref 3.77–5.28)
RDW: 13.4 % (ref 12.3–15.4)
WBC: 11.2 10*3/uL — ABNORMAL HIGH (ref 3.4–10.8)

## 2015-09-04 LAB — VITAMIN B12: Vitamin B-12: 376 pg/mL (ref 211–946)

## 2015-09-04 LAB — IRON AND TIBC
IRON: 70 ug/dL (ref 27–159)
Iron Saturation: 15 % (ref 15–55)
TIBC: 458 ug/dL — AB (ref 250–450)
UIBC: 388 ug/dL (ref 131–425)

## 2015-09-04 LAB — MICROALBUMIN, URINE: MICROALBUM., U, RANDOM: 23.7 ug/mL

## 2015-09-04 LAB — HGB A1C W/O EAG: Hgb A1c MFr Bld: 5.7 % — ABNORMAL HIGH (ref 4.8–5.6)

## 2015-09-04 LAB — TSH: TSH: 1.25 u[IU]/mL (ref 0.450–4.500)

## 2015-09-04 LAB — MAGNESIUM: MAGNESIUM: 2.1 mg/dL (ref 1.6–2.3)

## 2015-09-07 ENCOUNTER — Other Ambulatory Visit: Payer: Self-pay | Admitting: Physician Assistant

## 2015-09-13 NOTE — Addendum Note (Signed)
Addended by: Mayley Lish A on: 09/13/2015 07:02 PM   Modules accepted: Orders

## 2015-09-20 NOTE — Addendum Note (Signed)
Addended by: Keison Glendinning A on: 09/20/2015 10:55 AM   Modules accepted: Orders

## 2015-10-05 ENCOUNTER — Encounter: Payer: Self-pay | Admitting: Physician Assistant

## 2015-10-05 ENCOUNTER — Other Ambulatory Visit: Payer: Self-pay | Admitting: Physician Assistant

## 2015-11-13 ENCOUNTER — Other Ambulatory Visit: Payer: Self-pay | Admitting: Physician Assistant

## 2016-01-14 ENCOUNTER — Ambulatory Visit (INDEPENDENT_AMBULATORY_CARE_PROVIDER_SITE_OTHER): Payer: 59 | Admitting: Internal Medicine

## 2016-01-14 ENCOUNTER — Encounter: Payer: Self-pay | Admitting: Internal Medicine

## 2016-01-14 VITALS — BP 112/76 | HR 64 | Temp 97.9°F | Resp 16 | Ht 63.5 in | Wt 112.6 lb

## 2016-01-14 DIAGNOSIS — G43009 Migraine without aura, not intractable, without status migrainosus: Secondary | ICD-10-CM | POA: Diagnosis not present

## 2016-01-14 MED ORDER — PROPRANOLOL HCL 20 MG PO TABS: ORAL_TABLET | ORAL | Status: DC

## 2016-01-14 MED ORDER — AMITRIPTYLINE HCL 10 MG PO TABS: ORAL_TABLET | ORAL | Status: DC

## 2016-01-14 NOTE — Patient Instructions (Addendum)
Migraine Headache A migraine headache is an intense, throbbing pain on one or both sides of your head. A migraine can last for 30 minutes to several hours. CAUSES  The exact cause of a migraine headache is not always known. However, a migraine may be caused when nerves in the brain become irritated and release chemicals that cause inflammation. This causes pain. Certain things may also trigger migraines, such as:  Alcohol.  Smoking.  Stress.  Menstruation.  Aged cheeses.  Foods or drinks that contain nitrates, glutamate, aspartame, or tyramine.  Lack of sleep.  Chocolate.  Caffeine.  Hunger.  Physical exertion.  Fatigue.  Medicines used to treat chest pain (nitroglycerine), birth control pills, estrogen, and some blood pressure medicines. SIGNS AND SYMPTOMS  Pain on one or both sides of your head.  Pulsating or throbbing pain.  Severe pain that prevents daily activities.  Pain that is aggravated by any physical activity.  Nausea, vomiting, or both.  Dizziness.  Pain with exposure to bright lights, loud noises, or activity.  General sensitivity to bright lights, loud noises, or smells. Before you get a migraine, you may get warning signs that a migraine is coming (aura). An aura may include:  Seeing flashing lights.  Seeing bright spots, halos, or zigzag lines.  Having tunnel vision or blurred vision.  Having feelings of numbness or tingling.  Having trouble talking.  Having muscle weakness. DIAGNOSIS  A migraine headache is often diagnosed based on:  Symptoms.  Physical exam.  A CT scan or MRI of your head. These imaging tests cannot diagnose migraines, but they can help rule out other causes of headaches. TREATMENT Medicines may be given for pain and nausea. Medicines can also be given to help prevent recurrent migraines.  HOME CARE INSTRUCTIONS  Only take over-the-counter or prescription medicines for pain or discomfort as directed by your  health care provider. The use of long-term narcotics is not recommended.  Lie down in a dark, quiet room when you have a migraine.  Keep a journal to find out what may trigger your migraine headaches. For example, write down:  What you eat and drink.  How much sleep you get.  Any change to your diet or medicines.  Limit alcohol consumption.  Quit smoking if you smoke.  Get 7-9 hours of sleep, or as recommended by your health care provider.  Limit stress.  Keep lights dim if bright lights bother you and make your migraines worse. SEEK IMMEDIATE MEDICAL CARE IF:   Your migraine becomes severe.  You have a fever.  You have a stiff neck.  You have vision loss.  You have muscular weakness or loss of muscle control.  You start losing your balance or have trouble walking.  You feel faint or pass out.  You have severe symptoms that are different from your first symptoms. MAKE SURE YOU:   Understand these instructions.  Will watch your condition.  Will get help right away if you are not doing well or get worse.

## 2016-01-14 NOTE — Progress Notes (Signed)
  Subjective:    Patient ID: Heidi Stone, female    DOB: [birth year], 55 y.o.   MRN: [id number]  HPI  Patient presents with hx/o lifeling HA's which she relates have been dx'd as Migraine. Relates HA's began when she was in the 1st grade. And not until age 43 yo was she dx'd with Migraine. Relates she was followed att [facility] for years and failed or was intolerant to multiple Migraine preventative meds and in fact was treated for a period of time by Dr Estill Dooms with Diamax speculating that it prevented barometric pressure triggers for her HA's. Occasionaly 1-2 x month taks a prednisone for her HA's with good response. She describes her HA's as pressure or aching sensation around her eyes. Denies Aura.  No N/V, but does endorse photophobia. Relates ~ 15 HA's a month   Medication Sig  . BEPREVE 1.5 % SOLN PUT 1 DROP INTO BOTH EYES TWICE A DAY  . ibuprofen (ADVIL,MOTRIN) 800 MG tablet TAKE 1 TABLET THREE TIMES A DAY WITH FOOD AS NEEDED  . LOESTRIN FE 1/20 1-20 MG-MCG tablet TAKE 1 TABLET BY MOUTH EVERY DAY  . rizatriptan (MAXALT-MLT) 10 MG disintegrating tablet DISSOLVE 1 TABLET UNDER TONGUE AS NEEDED FOR MIGRAINE  . ZETIA 10 MG tablet TAKE 1 TABLET BY MOUTH EVERY DAY  . frovatriptan (FROVA) 2.5 MG tablet Take 1 tablet (2.5 mg total) by mouth as needed for migraine (No more than 2 tablets in 24 hrs).  Marland Kitchen azithromycin (ZITHROMAX) 250 MG tablet 2 tablets by mouth today then one tablet daily for 4 days.  Marland Kitchen FLONASE nasal spray Place 1 spray into both nostrils daily.  . predniSONE 20 MG tablet prn   No Known Allergies   Past Medical History  Diagnosis Date  . Allergy   . Hyperlipidemia   . Arthritis   . Migraines    Review of Systems  10 point systems review negative except as above.    Objective:   Physical Exam  BP 112/76 mmHg  Pulse 64  Temp(Src) 97.9 F (36.6 C)  Resp 16  Ht 5' 3.5" (1.613 m)  Wt 112 lb 9.6 oz (51.075 kg)  BMI 19.63 kg/m2  In No Apparent  Distress.  HEENT - Eac's patent. TM's Nl. EOM's full. PERRLA. NasoOroPharynx clear. Neck - supple. Nl Thyroid. Carotids 2+ & No bruits, nodes, JVD Chest - Clear equal BS w/o Rales, rhonchi, wheezes. Cor - Nl HS. RRR w/o sig MGR. PP 1(+). No edema. Abd - No palpable organomegaly, masses or tenderness. BS nl. MS- FROM w/o deformities. Muscle power, tone and bulk Nl. Gait Nl. Neuro - No obvious Cr N abnormalities. Sensory, motor and Cerebellar functions appear Nl w/o focal abnormalities. Psyche - Mental status normal & appropriate.  No delusions, ideations or obvious mood abnormalities.    Assessment & Plan:   1. Migraine without aura and without status migrainosus, not intractable  - propranolol (INDERAL) 20 MG tablet; Take 1 tablet 3 x day before meals to prevent migraine  Dispense: 90 tablet; Refill: 1 - amitriptyline (ELAVIL) 10 MG tablet; Take 1 tablet 3 x day to prevent migraine  Dispense: 90 tablet; Refill: 1 - Rx-Prednisone 20 mg #30 to use judiciously. - ROV 3 weeks

## 2016-01-18 ENCOUNTER — Telehealth: Payer: Self-pay | Admitting: *Deleted

## 2016-01-18 ENCOUNTER — Other Ambulatory Visit: Payer: Self-pay | Admitting: Internal Medicine

## 2016-01-18 NOTE — Telephone Encounter (Signed)
Patient states she started the Amitriptyline 10 mg and was taking 3 tabs daily.  She reports she is very sleepy and groggy.  She also reports having a migraine headache last PM.  Per Dr Melford Aase, continue the Propranololbut leave off the Amitriptyline 10 mg x 2-3 days and then restart at bedtime.  Patient is aware.

## 2016-01-20 ENCOUNTER — Other Ambulatory Visit: Payer: Self-pay | Admitting: Internal Medicine

## 2016-02-04 ENCOUNTER — Ambulatory Visit: Payer: Self-pay | Admitting: Internal Medicine

## 2016-02-16 ENCOUNTER — Other Ambulatory Visit: Payer: Self-pay | Admitting: Physician Assistant

## 2016-02-19 ENCOUNTER — Other Ambulatory Visit: Payer: Self-pay | Admitting: Physician Assistant

## 2016-02-29 ENCOUNTER — Ambulatory Visit: Payer: Self-pay | Admitting: Internal Medicine

## 2016-04-07 ENCOUNTER — Ambulatory Visit (INDEPENDENT_AMBULATORY_CARE_PROVIDER_SITE_OTHER): Payer: 59 | Admitting: Physician Assistant

## 2016-04-07 ENCOUNTER — Encounter: Payer: Self-pay | Admitting: Internal Medicine

## 2016-04-07 ENCOUNTER — Other Ambulatory Visit: Payer: Self-pay | Admitting: Physician Assistant

## 2016-04-07 ENCOUNTER — Encounter: Payer: Self-pay | Admitting: Physician Assistant

## 2016-04-07 VITALS — BP 110/60 | HR 81 | Temp 97.3°F | Resp 16 | Ht 63.5 in | Wt 111.2 lb

## 2016-04-07 DIAGNOSIS — E785 Hyperlipidemia, unspecified: Secondary | ICD-10-CM

## 2016-04-07 DIAGNOSIS — Z79899 Other long term (current) drug therapy: Secondary | ICD-10-CM

## 2016-04-07 DIAGNOSIS — M1990 Unspecified osteoarthritis, unspecified site: Secondary | ICD-10-CM

## 2016-04-07 DIAGNOSIS — Z0001 Encounter for general adult medical examination with abnormal findings: Secondary | ICD-10-CM | POA: Diagnosis not present

## 2016-04-07 DIAGNOSIS — Z139 Encounter for screening, unspecified: Secondary | ICD-10-CM

## 2016-04-07 DIAGNOSIS — R6889 Other general symptoms and signs: Secondary | ICD-10-CM | POA: Diagnosis not present

## 2016-04-07 DIAGNOSIS — Z1389 Encounter for screening for other disorder: Secondary | ICD-10-CM

## 2016-04-07 DIAGNOSIS — T7840XD Allergy, unspecified, subsequent encounter: Secondary | ICD-10-CM

## 2016-04-07 DIAGNOSIS — F411 Generalized anxiety disorder: Secondary | ICD-10-CM

## 2016-04-07 DIAGNOSIS — Z87898 Personal history of other specified conditions: Secondary | ICD-10-CM | POA: Insufficient documentation

## 2016-04-07 DIAGNOSIS — Z1231 Encounter for screening mammogram for malignant neoplasm of breast: Secondary | ICD-10-CM

## 2016-04-07 DIAGNOSIS — R7303 Prediabetes: Secondary | ICD-10-CM

## 2016-04-07 DIAGNOSIS — R7309 Other abnormal glucose: Secondary | ICD-10-CM

## 2016-04-07 DIAGNOSIS — G43009 Migraine without aura, not intractable, without status migrainosus: Secondary | ICD-10-CM

## 2016-04-07 MED ORDER — IBUPROFEN 800 MG PO TABS: ORAL_TABLET | ORAL | Status: DC

## 2016-04-07 MED ORDER — FROVATRIPTAN SUCCINATE 2.5 MG PO TABS: 2.5000 mg | ORAL_TABLET | ORAL | Status: DC | PRN

## 2016-04-07 MED ORDER — HYOSCYAMINE SULFATE 0.125 MG SL SUBL: 0.1250 mg | SUBLINGUAL_TABLET | SUBLINGUAL | Status: DC | PRN

## 2016-04-07 MED ORDER — EZETIMIBE 10 MG PO TABS: 10.0000 mg | ORAL_TABLET | Freq: Every day | ORAL | Status: DC

## 2016-04-07 MED ORDER — MOMETASONE FUROATE 50 MCG/ACT NA SUSP: 2.0000 | Freq: Every day | NASAL | Status: DC

## 2016-04-07 MED ORDER — BEPOTASTINE BESILATE 1.5 % OP SOLN: OPHTHALMIC | Status: DC

## 2016-04-07 MED ORDER — VENLAFAXINE HCL ER 37.5 MG PO CP24: 37.5000 mg | ORAL_CAPSULE | Freq: Every day | ORAL | Status: DC

## 2016-04-07 MED ORDER — CIPROFLOXACIN HCL 500 MG PO TABS: 500.0000 mg | ORAL_TABLET | Freq: Two times a day (BID) | ORAL | Status: DC

## 2016-04-07 MED ORDER — NORETHIN ACE-ETH ESTRAD-FE 1-20 MG-MCG PO TABS: 1.0000 | ORAL_TABLET | Freq: Every day | ORAL | Status: DC

## 2016-04-07 MED ORDER — RIZATRIPTAN BENZOATE 10 MG PO TBDP: ORAL_TABLET | ORAL | Status: DC

## 2016-04-07 MED ORDER — LEVOCETIRIZINE DIHYDROCHLORIDE 5 MG PO TABS: 5.0000 mg | ORAL_TABLET | Freq: Every evening | ORAL | Status: DC

## 2016-04-07 MED ORDER — PROMETHAZINE HCL 25 MG PO TABS: 25.0000 mg | ORAL_TABLET | Freq: Four times a day (QID) | ORAL | Status: DC | PRN

## 2016-04-07 NOTE — Progress Notes (Signed)
Complete Physical  Assessment and Plan: 1. Migraine without aura and without status migrainosus, not intractable - levocetirizine (XYZAL) 5 MG tablet; Take 1 tablet (5 mg total) by mouth every evening.  Dispense: 30 tablet; Refill: 3 - TSH - ibuprofen (ADVIL,MOTRIN) 800 MG tablet; TAKE 1 TABLET THREE TIMES A DAY WITH FOOD AS NEEDED  Dispense: 90 tablet; Refill: 3 - frovatriptan (FROVA) 2.5 MG tablet; Take 1 tablet (2.5 mg total) by mouth as needed for migraine (No more than 2 tablets in 24 hrs).  Dispense: 30 tablet; Refill: 3 - rizatriptan (MAXALT-MLT) 10 MG disintegrating tablet; DISSOLVE 1 TABLET UNDER TONGUE AS NEEDED FOR MIGRAINE  Dispense: 90 tablet; Refill: 3 - venlafaxine XR (EFFEXOR XR) 37.5 MG 24 hr capsule; Take 1 capsule (37.5 mg total) by mouth daily with breakfast.  Dispense: 30 capsule; Refill: 0  2. Hyperlipidemia - Lipid panel - ezetimibe (ZETIA) 10 MG tablet; Take 1 tablet (10 mg total) by mouth daily.  Dispense: 90 tablet; Refill: 1  3. Allergy, subsequent encounter - levocetirizine (XYZAL) 5 MG tablet; Take 1 tablet (5 mg total) by mouth every evening.  Dispense: 30 tablet; Refill: 3 - mometasone (NASONEX) 50 MCG/ACT nasal spray; Place 2 sprays into the nose daily.  Dispense: 17 g; Refill: 2  4. Arthritis  5. Prediabetes - Hemoglobin A1c  6. Encounter for general adult medical examination with abnormal findings - levocetirizine (XYZAL) 5 MG tablet; Take 1 tablet (5 mg total) by mouth every evening.  Dispense: 30 tablet; Refill: 3 - CBC with Differential/Platelet - BASIC METABOLIC PANEL WITH GFR - Hepatic function panel - TSH - Lipid panel - Hemoglobin A1c - Magnesium - Urinalysis, Routine w reflex microscopic (not at [facility]) - Microalbumin / creatinine urine ratio   7. Medication management - CBC with Differential/Platelet - BASIC METABOLIC PANEL WITH GFR - Hepatic function panel - Magnesium  8. Screening for blood or protein in urine - Urinalysis,  Routine w reflex microscopic (not at [facility]) - Microalbumin / creatinine urine ratio  9. Generalized anxiety disorder - venlafaxine XR (EFFEXOR XR) 37.5 MG 24 hr capsule; Take 1 capsule (37.5 mg total) by mouth daily with breakfast.  Dispense: 30 capsule; Refill: 0  Discussed med's effects and SE's. Screening labs and tests as requested with regular follow-up as recommended.  LABS WITH [facility]  HPI  55 y.o. female  presents for a complete physical, patient had CPE in Oct but states that it is okay with her insurance. Also going on a trip to [place] in [place].   Her blood pressure has been controlled at home, today their BP is BP: 110/60 mmHg She does workout. She denies chest pain, shortness of breath, dizziness.  She is on cholesterol medication, zetia every other day and denies myalgias. Her cholesterol is at goal. The cholesterol last visit was:  07/2013 (166) Lab Results  Component Value Date   CHOL 215* 09/03/2015   HDL 49 09/03/2015   LDLCALC 149* 09/03/2015   TRIG 87 09/03/2015   CHOLHDL 3.3 10/11/2013    Last A1C in the office was:  Lab Results  Component Value Date   HGBA1C 5.7* 09/03/2015  .  Patient is on Vitamin D supplement, she is on 2000 IU daily Lab Results  Component Value Date   VD25OH 47.4 09/04/2014    She has a history of migraines, she has been to Dr. Melton Alar and the headache clinic without help. She states with the rain/weather her migraines are worse, she was tried on propanolol  and elavil that states did not help her.   She has allergies and uses flonase, has tried claritin, Human resources officer and zyrtec, allergies make migraines worse.  She states she has been feeling more anxious and "made at the world"   Current Medications:  Current Outpatient Prescriptions on File Prior to Visit  Medication Sig Dispense Refill  . BEPREVE 1.5 % SOLN PUT 1 DROP INTO BOTH EYES TWICE A DAY 5 mL 1  . ezetimibe (ZETIA) 10 MG tablet TAKE 1 TABLET BY MOUTH EVERY DAY 30 tablet 2  .  fluticasone (FLONASE) 50 MCG/ACT nasal spray PLACE 1 SPRAY INTO BOTH NOSTRILS DAILY. 48 g 1  . ibuprofen (ADVIL,MOTRIN) 800 MG tablet TAKE 1 TABLET THREE TIMES A DAY WITH FOOD AS NEEDED 90 tablet 3  . LOESTRIN FE 1/20 1-20 MG-MCG tablet TAKE 1 TABLET BY MOUTH EVERY DAY 84 tablet 4  . rizatriptan (MAXALT-MLT) 10 MG disintegrating tablet DISSOLVE 1 TABLET UNDER TONGUE AS NEEDED FOR MIGRAINE 90 tablet 0  . frovatriptan (FROVA) 2.5 MG tablet Take 1 tablet (2.5 mg total) by mouth as needed for migraine (No more than 2 tablets in 24 hrs). 30 tablet 3   No current facility-administered medications on file prior to visit.   Health Maintenance:   Immunization History  Administered Date(s) Administered  . Influenza Split 08/28/2013, 08/28/2014, 07/29/2015  . Td 07/12/2010  . Tdap 07/29/2015   Tetanus:2016 Pneumovax: N/A Flu vaccine: 2015 Zostavax: N/A Pap: 07/2013 never abnormal pap due 2019 MGM: 07/2014, DUE, CAT D DEXA: no family history, on estrogen, Vitamin D Colonoscopy: Never  EGD: Last Dental Exam: Dr. Estill Bamberg [place] Last Eye Exam: Dr. Marica Otter, last week DERM- [facility]  Patient Care Team: Unk Pinto, MD as PCP - General (Internal Medicine)  Allergies: No Known Allergies Medical History:  Past Medical History  Diagnosis Date  . Allergy   . Hyperlipidemia   . Arthritis   . Migraines    Surgical History: No past surgical history on file. Family History:  Patient's  family history includes Hyperlipidemia in her father and mother; Hypertension in her mother. Social History:  She  reports that she has never smoked. She has never used smokeless tobacco. She reports that she does not drink alcohol or use illicit drugs.  Review of Systems  Constitutional: Negative for fever, chills and malaise/fatigue.  HENT: Positive for congestion. Negative for ear pain and sore throat.   Eyes: Negative.   Respiratory: Negative for cough, shortness of breath and wheezing.    Cardiovascular: Negative for chest pain, palpitations and leg swelling.  Gastrointestinal: Negative for heartburn, diarrhea, constipation, blood in stool and melena.  Genitourinary: Negative.   Neurological: Positive for headaches. Negative for dizziness, sensory change and loss of consciousness.  Psychiatric/Behavioral: Negative for depression. The patient is not nervous/anxious and does not have insomnia.     Physical Exam: Estimated body mass index is 19.39 kg/(m^2) as calculated from the following:   Height as of this encounter: 5' 3.5" (1.613 m).   Weight as of this encounter: 111 lb 3.2 oz (50.44 kg). BP 110/60 mmHg  Pulse 81  Temp(Src) 97.3 F (36.3 C) (Temporal)  Resp 16  Ht 5' 3.5" (1.613 m)  Wt 111 lb 3.2 oz (50.44 kg)  BMI 19.39 kg/m2  SpO2 97% General Appearance: Well nourished, in no apparent distress.  Eyes: PERRLA, EOMs, conjunctiva no swelling or erythema, normal fundi and vessels.  Sinuses: No Frontal/maxillary tenderness  ENT/Mouth: Ext aud canals clear, normal light reflex  with TMs without erythema, bulging. Good dentition. No erythema, swelling, or exudate on post pharynx. Tonsils not swollen or erythematous. Hearing normal. + TMJ Neck: Supple, thyroid normal. No bruits  Respiratory: Respiratory effort normal, BS equal bilaterally without rales, rhonchi, wheezing or stridor.  Cardio: RRR without murmurs, rubs or gallops. Brisk peripheral pulses without edema.  Chest: symmetric, with normal excursions and percussion.  Breasts: Symmetric, without lumps, nipple discharge, retractions.  Abdomen: Soft, nontender, no guarding, rebound, hernias, masses, or organomegaly. .  Lymphatics: Non tender without lymphadenopathy.  Genitourinary: defer Musculoskeletal: Full ROM all peripheral extremities,5/5 strength, and normal gait.  Skin: Warm, dry without rashes, lesions, ecchymosis. Neuro: Cranial nerves intact, reflexes equal bilaterally. Normal muscle tone, no cerebellar  symptoms. Sensation intact.  Psych: Awake and oriented X 3, normal affect, Insight and Judgment appropriate.   EKG: declines   Vicie Mutters 3:41 PM [facility]

## 2016-04-07 NOTE — Patient Instructions (Addendum)
Encourage you to get the 3D Mammogram  The 3D Mammogram is much more specific and sensitive to pick up breast cancer. For women with fibrocystic breast or lumpy breast it can be hard to determine if it is cancer or not but the 3D mammogram is able to tell this difference which cuts back on unneeded additional tests or scary call backs.   - over 40% increase in detection of breast cancer - over 40% reduction in false positives.  - fewer call backs - reduced anxiety - improved outcomes - [facility]  Try the effexor 37.5 mg once daily in the morning for 2-4 weeks, can go up to 2 pills in the morning, if you do well with this we can switch to 73m once daily WITH FOOD  Start on the xyzal, can continue flonase/nasonex with it for allergies/migraines. (if nasonex does not work as well can get generic flonase from [facility])   Try melatonin, the dissolvable or the gummies, and can go up to 234mat night 3059m before bed for sleep and migraine prevention  Okay to get a puppy  Cologuard is an easy to use noninvasive colon cancer screening test based on the latest advances in stool DNA science.   Colon cancer is 3rd most diagnosed cancer and 2nd leading cause of death in both men and women 50 61ars of age and older despite being one of the most preventable and treatable cancers if found early.  4 of out 5 people diagnosed with colon cancer have NO prior family history.  When caught EARLY 90% of colon cancer is curable.   You have agreed to do a Cologuard screening and have declined a colonoscopy in spite of being explained the risks and benefits of the colonoscopy in detail, including cancer and death. Please understand that this is test not as sensitive or specific as a colonoscopy and you are still recommended to get a colonoscopy.   If you are NOT medicare please call your insurance company and given them this CPT code, [id number] order to see how much your insurance company will cover Or you  can call [phone] talk with Cologuard about pricing and coverage.  Out-of-pocket cost for Cologuard can range from $0 - $649 so please call  You will receive a short call from [facility] at [employer] you receive a call they will say they are from [employer] confirm your mailing address and give you more information.  When they calll you, it will appear on the caller ID as "[employer]" or in some cases only this number will appear, [phone] [employer] ship your collection kit directly to you. You will collect a single stool sample in the privacy of your own home, no special preparation required. You will return the kit via [employer]-paid shipping or pick-up, in the same box it arrived in. Then I will contact you to discuss your results after I receive them from the laboratory.   If you have any questions or concerns, Cologuard Customer Support Specialist are available 24 hours a day, 7 days a week at [phone] go to [email or web address] TRAVEL MEDS INFORMATION If you are traveling you can take these medications to be more prepared. If you get chest pain, shortness of breath or abdominal pain please go to the hospital wherever you may be.   Ciprofloxacin is good for travelers diarrhea, you can take 2 pills a  day for 7 days. Or it is also good for urinary tract infections, you can take 2 a day for 7 days.  Phenergran is for nausea but it sedating so plan on eating and sleeping.  Levsin is good for nausea, diarrhea, or abdominal cramping- it can constipate you so don't take too much. This dissolves under your tongue.

## 2016-04-11 ENCOUNTER — Other Ambulatory Visit: Payer: Self-pay | Admitting: Physician Assistant

## 2016-04-11 MED ORDER — PREDNISONE 20 MG PO TABS: ORAL_TABLET | ORAL | Status: DC

## 2016-04-12 ENCOUNTER — Encounter: Payer: Self-pay | Admitting: Physician Assistant

## 2016-04-13 ENCOUNTER — Encounter: Payer: Self-pay | Admitting: Physician Assistant

## 2016-04-15 ENCOUNTER — Telehealth: Payer: Self-pay | Admitting: Internal Medicine

## 2016-04-15 DIAGNOSIS — E785 Hyperlipidemia, unspecified: Secondary | ICD-10-CM

## 2016-04-15 DIAGNOSIS — G43009 Migraine without aura, not intractable, without status migrainosus: Secondary | ICD-10-CM

## 2016-04-15 DIAGNOSIS — R7303 Prediabetes: Secondary | ICD-10-CM

## 2016-04-15 NOTE — Addendum Note (Signed)
Addended by: Vicie Mutters R on: 04/15/2016 11:08 AM   Modules accepted: Orders, SmartSet

## 2016-04-15 NOTE — Telephone Encounter (Signed)
patient checked with her insur and she can do labs with [facility]. Please enter labs so she can come in to the office next week for a lab only visit.

## 2016-04-18 ENCOUNTER — Other Ambulatory Visit: Payer: 59

## 2016-04-18 ENCOUNTER — Other Ambulatory Visit: Payer: Self-pay

## 2016-04-18 ENCOUNTER — Other Ambulatory Visit: Payer: Self-pay | Admitting: Physician Assistant

## 2016-04-18 DIAGNOSIS — R7303 Prediabetes: Secondary | ICD-10-CM

## 2016-04-18 DIAGNOSIS — E785 Hyperlipidemia, unspecified: Secondary | ICD-10-CM

## 2016-04-18 DIAGNOSIS — Z79899 Other long term (current) drug therapy: Secondary | ICD-10-CM

## 2016-04-18 DIAGNOSIS — Z0001 Encounter for general adult medical examination with abnormal findings: Secondary | ICD-10-CM

## 2016-04-18 DIAGNOSIS — Z1231 Encounter for screening mammogram for malignant neoplasm of breast: Secondary | ICD-10-CM

## 2016-04-18 DIAGNOSIS — F411 Generalized anxiety disorder: Secondary | ICD-10-CM

## 2016-04-18 DIAGNOSIS — Z1389 Encounter for screening for other disorder: Secondary | ICD-10-CM

## 2016-04-18 LAB — CBC WITH DIFFERENTIAL/PLATELET
BASOS ABS: 59 {cells}/uL (ref 0–200)
Basophils Relative: 1 %
Eosinophils Absolute: 118 cells/uL (ref 15–500)
Eosinophils Relative: 2 %
HEMATOCRIT: 36.4 % (ref 35.0–45.0)
HEMOGLOBIN: 12.3 g/dL (ref 11.7–15.5)
LYMPHS ABS: 2124 {cells}/uL (ref 850–3900)
Lymphocytes Relative: 36 %
MCH: 29.8 pg (ref 27.0–33.0)
MCHC: 33.8 g/dL (ref 32.0–36.0)
MCV: 88.1 fL (ref 80.0–100.0)
MONO ABS: 354 {cells}/uL (ref 200–950)
MPV: 10.2 fL (ref 7.5–12.5)
Monocytes Relative: 6 %
NEUTROS PCT: 55 %
Neutro Abs: 3245 cells/uL (ref 1500–7800)
Platelets: 261 10*3/uL (ref 140–400)
RBC: 4.13 MIL/uL (ref 3.80–5.10)
RDW: 13.5 % (ref 11.0–15.0)
WBC: 5.9 10*3/uL (ref 3.8–10.8)

## 2016-04-19 ENCOUNTER — Ambulatory Visit: Payer: Self-pay

## 2016-04-19 LAB — URINALYSIS, ROUTINE W REFLEX MICROSCOPIC
BILIRUBIN URINE: NEGATIVE
Glucose, UA: NEGATIVE
Hgb urine dipstick: NEGATIVE
Ketones, ur: NEGATIVE
LEUKOCYTES UA: NEGATIVE
Nitrite: NEGATIVE
PROTEIN: NEGATIVE
Specific Gravity, Urine: 1.018 (ref 1.001–1.035)
pH: 6 (ref 5.0–8.0)

## 2016-04-19 LAB — LIPID PANEL
Cholesterol: 169 mg/dL (ref 125–200)
HDL: 54 mg/dL (ref >=46)
LDL CALC: 95 mg/dL (ref <130)
Total CHOL/HDL Ratio: 3.1 Ratio (ref <=5.0)
Triglycerides: 102 mg/dL (ref <150)
VLDL: 20 mg/dL (ref <30)

## 2016-04-19 LAB — HEPATIC FUNCTION PANEL
ALT: 9 U/L (ref 6–29)
AST: 13 U/L (ref 10–35)
Albumin: 4 g/dL (ref 3.6–5.1)
Alkaline Phosphatase: 63 U/L (ref 33–130)
BILIRUBIN DIRECT: 0.1 mg/dL (ref <=0.2)
BILIRUBIN INDIRECT: 0.3 mg/dL (ref 0.2–1.2)
BILIRUBIN TOTAL: 0.4 mg/dL (ref 0.2–1.2)
TOTAL PROTEIN: 6.5 g/dL (ref 6.1–8.1)

## 2016-04-19 LAB — MICROALBUMIN / CREATININE URINE RATIO
CREATININE, URINE: 159 mg/dL (ref 20–320)
MICROALB/CREAT RATIO: 10 ug/mg{creat} (ref <30)
Microalb, Ur: 1.6 mg/dL

## 2016-04-19 LAB — BASIC METABOLIC PANEL WITH GFR
BUN: 15 mg/dL (ref 7–25)
CALCIUM: 8.5 mg/dL — AB (ref 8.6–10.4)
CHLORIDE: 107 mmol/L (ref 98–110)
CO2: 24 mmol/L (ref 20–31)
Creat: 0.95 mg/dL (ref 0.50–1.05)
GFR, EST AFRICAN AMERICAN: 77 mL/min (ref >=60)
GFR, EST NON AFRICAN AMERICAN: 67 mL/min (ref >=60)
Glucose, Bld: 86 mg/dL (ref 65–99)
POTASSIUM: 4.1 mmol/L (ref 3.5–5.3)
SODIUM: 141 mmol/L (ref 135–146)

## 2016-04-19 LAB — MAGNESIUM: Magnesium: 2.2 mg/dL (ref 1.5–2.5)

## 2016-04-19 LAB — TSH: TSH: 1.29 m[IU]/L

## 2016-05-05 ENCOUNTER — Ambulatory Visit: Payer: Self-pay

## 2016-05-10 ENCOUNTER — Ambulatory Visit: Admission: RE | Admit: 2016-05-10 | Payer: 59 | Source: Ambulatory Visit

## 2016-05-10 ENCOUNTER — Ambulatory Visit
Admission: RE | Admit: 2016-05-10 | Discharge: 2016-05-10 | Disposition: A | Discharge status: Home / Self Care | Payer: 59 | Source: Ambulatory Visit | Attending: Physician Assistant | Admitting: Physician Assistant

## 2016-05-10 ENCOUNTER — Other Ambulatory Visit: Payer: Self-pay | Admitting: Physician Assistant

## 2016-05-10 DIAGNOSIS — Z1231 Encounter for screening mammogram for malignant neoplasm of breast: Secondary | ICD-10-CM

## 2016-05-19 ENCOUNTER — Telehealth: Payer: Self-pay

## 2016-05-19 NOTE — Telephone Encounter (Signed)
-----   Message from Vicie Mutters, [place] sent at 05/19/2016 10:56 AM EDT ----- Regarding: RE: LAB QUESTION Contact: [phone] We have that she has been in the PreDiabetes category but in the DM range.  Best Regards, Vicie Mutters, PA-C    ----- Message -----    From: Elenor Quinones, CMA    Sent: 05/19/2016   9:06 AM      To: Vicie Mutters, PA-C Subject: LAB QUESTION                                   Pt has question about being a Diabetic, pt states that her insurance said she was diabetic & pt states she was never told that. Looking back at her most recent labs there is no indication of her having diabetes. I informed her of this as well but she asked that i check with you & then get back to her. Thanks

## 2016-05-19 NOTE — Telephone Encounter (Signed)
-----   Message from Vicie Mutters, [place] sent at 05/19/2016 10:56 AM EDT ----- Regarding: RE: LAB QUESTION Contact: [phone] We have that she has been in the PreDiabetes category but in the DM range.  Best Regards, Vicie Mutters, PA-C    ----- Message -----    From: Elenor Quinones, CMA    Sent: 05/19/2016   9:06 AM      To: Vicie Mutters, PA-C Subject: LAB QUESTION                                   Pt has question about being a Diabetic, pt states that her insurance said she was diabetic & pt states she was never told that. Looking back at her most recent labs there is no indication of her having diabetes. I informed her of this as well but she asked that i check with you & then get back to her. Thanks

## 2016-05-19 NOTE — Telephone Encounter (Signed)
Spoke with pt & informed her that she was prediabetic & she could lower those numbers by watching what she eats, stay away from anything made from white flour,sweets & exercising. Pt agreed & voiced understanding.

## 2016-05-19 NOTE — Telephone Encounter (Signed)
LVM FOR PT TO RETURN OFFICE CALL ABOUT HER MESSAGE.

## 2016-08-22 ENCOUNTER — Other Ambulatory Visit: Payer: Self-pay | Admitting: Internal Medicine

## 2016-09-05 ENCOUNTER — Encounter: Payer: Self-pay | Admitting: Internal Medicine

## 2016-10-10 ENCOUNTER — Other Ambulatory Visit: Payer: Self-pay | Admitting: Physician Assistant

## 2016-10-12 ENCOUNTER — Ambulatory Visit: Payer: Self-pay | Admitting: Physician Assistant

## 2016-11-03 ENCOUNTER — Other Ambulatory Visit: Payer: Self-pay | Admitting: Physician Assistant

## 2016-11-03 DIAGNOSIS — Z0001 Encounter for general adult medical examination with abnormal findings: Secondary | ICD-10-CM

## 2016-11-03 DIAGNOSIS — E785 Hyperlipidemia, unspecified: Secondary | ICD-10-CM

## 2016-11-09 ENCOUNTER — Telehealth: Payer: Self-pay

## 2016-11-09 ENCOUNTER — Other Ambulatory Visit: Payer: Self-pay | Admitting: Physician Assistant

## 2016-11-09 DIAGNOSIS — E785 Hyperlipidemia, unspecified: Secondary | ICD-10-CM

## 2016-11-09 DIAGNOSIS — Z0001 Encounter for general adult medical examination with abnormal findings: Secondary | ICD-10-CM

## 2016-11-09 MED ORDER — EZETIMIBE 10 MG PO TABS: ORAL_TABLET | ORAL | 1 refills | Status: DC

## 2016-11-09 NOTE — Telephone Encounter (Signed)
Pt called wanting to know why her ZETIA had only been filled for 10 days and not 30 days.  Informed pt that per Estill Bamberg she would talk to Dr. Melford Aase & ascertain why she only got 10 pills. Pt agreed & hung up.

## 2016-11-10 ENCOUNTER — Other Ambulatory Visit: Payer: Self-pay | Admitting: Internal Medicine

## 2016-11-10 DIAGNOSIS — E785 Hyperlipidemia, unspecified: Secondary | ICD-10-CM

## 2016-11-10 DIAGNOSIS — Z0001 Encounter for general adult medical examination with abnormal findings: Secondary | ICD-10-CM

## 2016-12-21 ENCOUNTER — Other Ambulatory Visit: Payer: Self-pay | Admitting: Internal Medicine

## 2017-02-08 ENCOUNTER — Other Ambulatory Visit: Payer: Self-pay | Admitting: Physician Assistant

## 2017-03-09 ENCOUNTER — Telehealth: Payer: Self-pay

## 2017-03-09 NOTE — Telephone Encounter (Signed)
Pt reports stressful incident happened on yesterday & has had some pressure & heaviness in chest. Pt states this has gotten better but still there, denies SOB or arm pain, please advise.  Per provider can try 2 EFFEXOR & if any worsen CHEST PAIN/SOB/SWEATY. GO TO ER.   LVM for pt to return office call/waiting.

## 2017-04-20 NOTE — Progress Notes (Signed)
Complete Physical  Assessment and Plan:  Migraine without aura and without status migrainosus, not intractable Check labs Follow up neuro -     Sedimentation rate -     C-reactive protein -     ibuprofen (ADVIL,MOTRIN) 800 MG tablet; TAKE 1 TABLET THREE TIMES A DAY WITH FOOD AS NEEDED  Arthritis monitor  Allergic state, subsequent encounter  Hyperlipidemia, unspecified hyperlipidemia type -continue medications, check lipids, decrease fatty foods, increase activity.  -     TSH -     Lipid panel -     EKG 12-Lead -     ezetimibe (ZETIA) 10 MG tablet; Take 1 tablet daily for Cholesterol  Prediabetes Discussed general issues about diabetes pathophysiology and management., Educational material distributed., Suggested low cholesterol diet., Encouraged aerobic exercise., Discussed foot care., Reminded to get yearly retinal exam. -     TSH -     Hemoglobin A1c -     EKG 12-Lead  Encounter for general adult medical examination with abnormal findings -     ezetimibe (ZETIA) 10 MG tablet; Take 1 tablet daily for Cholesterol -     ibuprofen (ADVIL,MOTRIN) 800 MG tablet; TAKE 1 TABLET THREE TIMES A DAY WITH FOOD AS NEEDED  Medication management -     CBC with Differential/Platelet -     BASIC METABOLIC PANEL WITH GFR -     Hepatic function panel  Vitamin D deficiency -     VITAMIN D 25 Hydroxy (Vit-D Deficiency, Fractures)  Allergy, subsequent encounter  Other orders -     fluticasone (FLONASE) 50 MCG/ACT nasal spray; PLACE 1 SPRAY INTO BOTH NOSTRILS DAILY. -     predniSONE (DELTASONE) 20 MG tablet; 2 tablets daily for 3 days, 1 tablet daily for 4 days.   Discussed med's effects and SE's. Screening labs and tests as requested with regular follow-up as recommended.  HPI  56 y.o. female  presents for a complete physical and follow up for preDM, HTN, HA.   Her blood pressure has been controlled at home, today their BP is BP: 118/72 She does workout. She denies chest pain,  shortness of breath, dizziness.  She is on cholesterol medication, zetia every day and denies myalgias. Her cholesterol is at goal. The cholesterol last visit was:  07/2013 (166) Lab Results  Component Value Date   CHOL 169 04/18/2016   HDL 54 04/18/2016   LDLCALC 95 04/18/2016   TRIG 102 04/18/2016   CHOLHDL 3.1 04/18/2016    Last A1C in the office was:  Lab Results  Component Value Date   HGBA1C 5.7 (H) 09/03/2015  .  Patient is on Vitamin D supplement, she is on 2000 IU daily Lab Results  Component Value Date   VD25OH 47.4 09/04/2014    She has a history of migraines, she has been to Dr. Melton Alar and the headache clinic without help, has OV to go see Dr. Lavell Anchors for headaches for possible botox. She states with the rain/weather her migraines are worse, she was tried on propanolol and elavil that states did not help her. Prednisone helps significantly for 2 weeks but then comes back. Also has allergies that states makes her HA worse. She will take ibuprofen 4-5 x a month.   Current Medications:  Current Outpatient Prescriptions on File Prior to Visit  Medication Sig Dispense Refill  . fluticasone (FLONASE) 50 MCG/ACT nasal spray PLACE 1 SPRAY INTO BOTH NOSTRILS DAILY. 48 g 3  . ibuprofen (ADVIL,MOTRIN) 800 MG tablet TAKE 1 TABLET  THREE TIMES A DAY WITH FOOD AS NEEDED 90 tablet 3  . LOESTRIN FE 1/20 1-20 MG-MCG tablet TAKE 1 TABLET BY MOUTH DAILY. 84 tablet 4  . norethindrone-ethinyl estradiol (LOESTRIN FE 1/20) 1-20 MG-MCG tablet Take 1 tablet by mouth daily. 84 tablet 4  . rizatriptan (MAXALT-MLT) 10 MG disintegrating tablet DISSOLVE 1 TABLET UNDER TONGUE AS NEEDED FOR MIGRAINE 90 tablet 3  . ezetimibe (ZETIA) 10 MG tablet Take 1 tablet daily for Cholesterol 90 tablet 1  . frovatriptan (FROVA) 2.5 MG tablet Take 1 tablet (2.5 mg total) by mouth as needed for migraine (No more than 2 tablets in 24 hrs). 30 tablet 3  . mometasone (NASONEX) 50 MCG/ACT nasal spray Place 2 sprays into the  nose daily. 17 g 2   No current facility-administered medications on file prior to visit.    Health Maintenance:   Immunization History  Administered Date(s) Administered  . Influenza Split 08/28/2013, 08/28/2014, 07/29/2015  . Td 07/12/2010  . Tdap 07/29/2015   Tetanus:2016 Pneumovax: N/A Flu vaccine: 2015 Zostavax: N/A Pap: 07/2013 never abnormal pap due 2019 MGM: 04/2016, DUE, CAT D DEXA: no family history, on estrogen, Vitamin D due age 1 Colonoscopy: Never will call about cologuard EGD: Last Dental Exam: Dr. Estill Bamberg [place] Last Eye Exam: Dr. Marica Otter DERM- [facility]  Patient Care Team: Unk Pinto, MD as PCP - General (Internal Medicine)  Allergies: No Known Allergies Medical History:  Past Medical History:  Diagnosis Date  . Allergy   . Arthritis   . Hyperlipidemia   . Migraines    Surgical History: No past surgical history on file. Family History:  Patient's  family history includes Hyperlipidemia in her father and mother; Hypertension in her mother. Social History:  She  reports that she has never smoked. She has never used smokeless tobacco. She reports that she does not drink alcohol or use drugs.  Review of Systems  Constitutional: Negative for chills, fever and malaise/fatigue.  HENT: Positive for congestion. Negative for ear pain and sore throat.   Eyes: Negative.   Respiratory: Negative for cough, shortness of breath and wheezing.   Cardiovascular: Negative for chest pain, palpitations and leg swelling.  Gastrointestinal: Negative for blood in stool, constipation, diarrhea, heartburn and melena.  Genitourinary: Negative.   Neurological: Positive for headaches. Negative for dizziness, sensory change and loss of consciousness.  Psychiatric/Behavioral: Negative for depression. The patient is not nervous/anxious and does not have insomnia.     Physical Exam: Estimated body mass index is 19.26 kg/m as calculated from the following:    Height as of this encounter: 5\' 4"  (1.626 m).   Weight as of this encounter: 112 lb 3.2 oz (50.9 kg). BP 118/72   Pulse 76   Temp 97.1 F (36.2 C)   Resp 14   Ht 5\' 4"  (1.626 m)   Wt 112 lb 3.2 oz (50.9 kg)   SpO2 98%   BMI 19.26 kg/m  General Appearance: Well nourished, in no apparent distress.  Eyes: PERRLA, EOMs, conjunctiva no swelling or erythema, normal fundi and vessels.  Sinuses: No Frontal/maxillary tenderness  ENT/Mouth: Ext aud canals clear, normal light reflex with TMs without erythema, bulging. Good dentition. No erythema, swelling, or exudate on post pharynx. Tonsils not swollen or erythematous. Hearing normal. Neck: Supple, thyroid normal. No bruits  Respiratory: Respiratory effort normal, BS equal bilaterally without rales, rhonchi, wheezing or stridor.  Cardio: RRR without murmurs, rubs or gallops. Brisk peripheral pulses without edema.  Chest: symmetric, with  normal excursions and percussion.  Breasts: Symmetric, without lumps, nipple discharge, retractions.  Abdomen: Soft, nontender, no guarding, rebound, hernias, masses, or organomegaly. .  Lymphatics: Non tender without lymphadenopathy.  Genitourinary: defer Musculoskeletal: Full ROM all peripheral extremities,5/5 strength, and normal gait.  Skin: Warm, dry without rashes, lesions, ecchymosis. Neuro: Cranial nerves intact, reflexes equal bilaterally. Normal muscle tone, no cerebellar symptoms. Sensation intact.  Psych: Awake and oriented X 3, normal affect, Insight and Judgment appropriate.   EKG:  WNL   Vicie Mutters 2:06 PM [facility]

## 2017-04-24 ENCOUNTER — Encounter: Payer: Self-pay | Admitting: Physician Assistant

## 2017-04-24 ENCOUNTER — Telehealth: Payer: Self-pay

## 2017-04-24 ENCOUNTER — Ambulatory Visit (INDEPENDENT_AMBULATORY_CARE_PROVIDER_SITE_OTHER): Payer: 59 | Admitting: Physician Assistant

## 2017-04-24 VITALS — BP 118/72 | HR 76 | Temp 97.1°F | Resp 14 | Ht 64.0 in | Wt 112.2 lb

## 2017-04-24 DIAGNOSIS — G43009 Migraine without aura, not intractable, without status migrainosus: Secondary | ICD-10-CM

## 2017-04-24 DIAGNOSIS — Z79899 Other long term (current) drug therapy: Secondary | ICD-10-CM

## 2017-04-24 DIAGNOSIS — Z0001 Encounter for general adult medical examination with abnormal findings: Secondary | ICD-10-CM | POA: Diagnosis not present

## 2017-04-24 DIAGNOSIS — E785 Hyperlipidemia, unspecified: Secondary | ICD-10-CM | POA: Diagnosis not present

## 2017-04-24 DIAGNOSIS — T7840XD Allergy, unspecified, subsequent encounter: Secondary | ICD-10-CM

## 2017-04-24 DIAGNOSIS — R7303 Prediabetes: Secondary | ICD-10-CM

## 2017-04-24 DIAGNOSIS — E559 Vitamin D deficiency, unspecified: Secondary | ICD-10-CM | POA: Diagnosis not present

## 2017-04-24 DIAGNOSIS — M1990 Unspecified osteoarthritis, unspecified site: Secondary | ICD-10-CM | POA: Diagnosis not present

## 2017-04-24 LAB — BASIC METABOLIC PANEL WITH GFR
BUN: 16 mg/dL (ref 7–25)
CO2: 26 mmol/L (ref 20–31)
Calcium: 9.5 mg/dL (ref 8.6–10.4)
Chloride: 105 mmol/L (ref 98–110)
Creat: 0.83 mg/dL (ref 0.50–1.05)
GFR, Est Non African American: 78 mL/min (ref >=60)
GLUCOSE: 98 mg/dL (ref 65–99)
Potassium: 4.1 mmol/L (ref 3.5–5.3)
Sodium: 140 mmol/L (ref 135–146)

## 2017-04-24 LAB — LIPID PANEL
Cholesterol: 193 mg/dL (ref <200)
HDL: 45 mg/dL — ABNORMAL LOW (ref >50)
LDL CALC: 130 mg/dL — AB (ref <100)
Total CHOL/HDL Ratio: 4.3 Ratio (ref <5.0)
Triglycerides: 89 mg/dL (ref <150)
VLDL: 18 mg/dL (ref <30)

## 2017-04-24 LAB — CBC WITH DIFFERENTIAL/PLATELET
BASOS PCT: 1 %
Basophils Absolute: 49 cells/uL (ref 0–200)
EOS ABS: 49 {cells}/uL (ref 15–500)
Eosinophils Relative: 1 %
HEMATOCRIT: 39.5 % (ref 35.0–45.0)
HEMOGLOBIN: 13 g/dL (ref 11.7–15.5)
LYMPHS ABS: 1568 {cells}/uL (ref 850–3900)
Lymphocytes Relative: 32 %
MCH: 29.9 pg (ref 27.0–33.0)
MCHC: 32.9 g/dL (ref 32.0–36.0)
MCV: 90.8 fL (ref 80.0–100.0)
MONO ABS: 343 {cells}/uL (ref 200–950)
MPV: 10.2 fL (ref 7.5–12.5)
Monocytes Relative: 7 %
NEUTROS PCT: 59 %
Neutro Abs: 2891 cells/uL (ref 1500–7800)
Platelets: 298 10*3/uL (ref 140–400)
RBC: 4.35 MIL/uL (ref 3.80–5.10)
RDW: 13.1 % (ref 11.0–15.0)
WBC: 4.9 10*3/uL (ref 3.8–10.8)

## 2017-04-24 LAB — HEPATIC FUNCTION PANEL
ALBUMIN: 4 g/dL (ref 3.6–5.1)
ALK PHOS: 70 U/L (ref 33–130)
ALT: 14 U/L (ref 6–29)
AST: 18 U/L (ref 10–35)
BILIRUBIN INDIRECT: 0.4 mg/dL (ref 0.2–1.2)
Bilirubin, Direct: 0.1 mg/dL (ref <=0.2)
TOTAL PROTEIN: 6.5 g/dL (ref 6.1–8.1)
Total Bilirubin: 0.5 mg/dL (ref 0.2–1.2)

## 2017-04-24 LAB — TSH: TSH: 1.33 mIU/L

## 2017-04-24 MED ORDER — EZETIMIBE 10 MG PO TABS: ORAL_TABLET | ORAL | 1 refills | Status: DC

## 2017-04-24 MED ORDER — PREDNISONE 20 MG PO TABS: ORAL_TABLET | ORAL | 0 refills | Status: DC

## 2017-04-24 MED ORDER — FLUTICASONE PROPIONATE 50 MCG/ACT NA SUSP: NASAL | 3 refills | Status: DC

## 2017-04-24 MED ORDER — IBUPROFEN 800 MG PO TABS: ORAL_TABLET | ORAL | 3 refills | Status: DC

## 2017-04-24 NOTE — Patient Instructions (Addendum)
Take the estrogen every other day for 1 month and then stop it to see if this helps with the headache   Cholesterol Cholesterol is a white, waxy, fat-like substance that is needed by the human body in small amounts. The liver makes all the cholesterol we need. Cholesterol is carried from the liver by the blood through the blood vessels. Deposits of cholesterol (plaques) may build up on blood vessel (artery) walls. Plaques make the arteries narrower and stiffer. Cholesterol plaques increase the risk for heart attack and stroke. You cannot feel your cholesterol level even if it is very high. The only way to know that it is high is to have a blood test. Once you know your cholesterol levels, you should keep a record of the test results. Work with your health care provider to keep your levels in the desired range. What do the results mean?  Total cholesterol is a rough measure of all the cholesterol in your blood.  LDL (low-density lipoprotein) is the "bad" cholesterol. This is the type that causes plaque to build up on the artery walls. You want this level to be low.  HDL (high-density lipoprotein) is the "good" cholesterol because it cleans the arteries and carries the LDL away. You want this level to be high.  Triglycerides are fat that the body can either burn for energy or store. High levels are closely linked to heart disease. What are the desired levels of cholesterol?  Total cholesterol below 200.  LDL below 100 for people who are at risk, below 70 for people at very high risk.  HDL above 40 is good. A level of 60 or higher is considered to be protective against heart disease.  Triglycerides below 150. How can I lower my cholesterol? Diet Follow your diet program as told by your health care provider.  Choose fish or white meat chicken and [place], roasted or baked. Limit fatty cuts of red meat, fried foods, and processed meats, such as sausage and lunch meats.  Eat lots of fresh  fruits and vegetables.  Choose whole grains, beans, pasta, potatoes, and cereals.  Choose olive oil, corn oil, or canola oil, and use only small amounts.  Avoid butter, mayonnaise, shortening, or palm kernel oils.  Avoid foods with trans fats.  Drink skim or nonfat milk and eat low-fat or nonfat yogurt and cheeses. Avoid whole milk, cream, ice cream, egg yolks, and full-fat cheeses.  Healthier desserts include angel food cake, ginger snaps, animal crackers, hard candy, popsicles, and low-fat or nonfat frozen yogurt. Avoid pastries, cakes, pies, and cookies.  Exercise  Follow your exercise program as told by your health care provider. A regular program: ? Helps to decrease LDL and raise HDL. ? Helps with weight control.  Do things that increase your activity level, such as gardening, walking, and taking the stairs.  Ask your health care provider about ways that you can be more active in your daily life.  Medicine  Take over-the-counter and prescription medicines only as told by your health care provider. ? Medicine may be prescribed by your health care provider to help lower cholesterol and decrease the risk for heart disease. This is usually done if diet and exercise have failed to bring down cholesterol levels. ? If you have several risk factors, you may need medicine even if your levels are normal.  This information is not intended to replace advice given to you by your health care provider. Make sure you discuss any questions you have with  your health care provider. Document Released: 07/12/2001 Document Revised: 05/14/2016 Document Reviewed: 04/16/2016 Elsevier Interactive Patient Education  2017 [place] is an easy to use noninvasive colon cancer screening test based on the latest advances in stool DNA science.   Colon cancer is 3rd most diagnosed cancer and 2nd leading cause of death in both men and women 58 years of age and older despite being one of the  most preventable and treatable cancers if found early.  4 of out 5 people diagnosed with colon cancer have NO prior family history.  When caught EARLY 90% of colon cancer is curable.   You have agreed to do a Cologuard screening and have declined a colonoscopy in spite of being explained the risks and benefits of the colonoscopy in detail, including cancer and death. Please understand that this is test not as sensitive or specific as a colonoscopy and you are still recommended to get a colonoscopy.   If you are NOT medicare please call your insurance company and give them these items to see if they will cover it: 1) CPT code, [phone] 2) Provider is Probation officer 3) [employer] NPI ([id number] 4) [place] Tax ID [phone]  Out-of-pocket cost for Cologuard can range from $0 - $649 so please call  You will receive a short call from [place] support center at [employer], when you receive a call they will say they are from [place],  to confirm your mailing address and give you more information.  When they calll you, it will appear on the caller ID as "[employer]" or in some cases only this number will appear, [phone].   [employer] will ship your collection kit directly to you. You will collect a single stool sample in the privacy of your own home, no special preparation required. You will return the kit via [employer] pre-paid shipping or pick-up, in the same box it arrived in. Then I will contact you to discuss your results after I receive them from the laboratory.   If you have any questions or concerns, Cologuard Customer Support Specialist are available 24 hours a day, 7 days a week at [phone] or go to [email or web address].

## 2017-04-24 NOTE — Telephone Encounter (Signed)
Pt called wanting to know why on her AVS states she has a Vit. D, pre-DM, Arthritis-ect.  Per provider pt has had these issues in the past & they roll over from history but it can be removed at her next office visit.

## 2017-04-25 ENCOUNTER — Encounter: Payer: Self-pay | Admitting: Physician Assistant

## 2017-04-25 LAB — HEMOGLOBIN A1C
HEMOGLOBIN A1C: 5.5 % (ref <5.7)
MEAN PLASMA GLUCOSE: 111 mg/dL

## 2017-04-25 LAB — SEDIMENTATION RATE: Sed Rate: 5 mm/hr (ref 0–30)

## 2017-04-25 LAB — C-REACTIVE PROTEIN: CRP: 2.9 mg/L (ref <8.0)

## 2017-04-25 LAB — VITAMIN D 25 HYDROXY (VIT D DEFICIENCY, FRACTURES): VIT D 25 HYDROXY: 89 ng/mL (ref 30–100)

## 2017-05-29 ENCOUNTER — Other Ambulatory Visit: Payer: Self-pay | Admitting: Physician Assistant

## 2017-05-29 DIAGNOSIS — E785 Hyperlipidemia, unspecified: Secondary | ICD-10-CM

## 2017-05-29 DIAGNOSIS — Z0001 Encounter for general adult medical examination with abnormal findings: Secondary | ICD-10-CM

## 2017-06-01 ENCOUNTER — Encounter: Payer: Self-pay | Admitting: Neurology

## 2017-06-01 ENCOUNTER — Ambulatory Visit (INDEPENDENT_AMBULATORY_CARE_PROVIDER_SITE_OTHER): Payer: 59 | Admitting: Neurology

## 2017-06-01 DIAGNOSIS — G43711 Chronic migraine without aura, intractable, with status migrainosus: Secondary | ICD-10-CM

## 2017-06-01 NOTE — Patient Instructions (Signed)

## 2017-06-01 NOTE — Progress Notes (Signed)
[facility]    Provider:  Dr Jaynee Eagles Referring Provider: Unk Pinto, MD Primary Care Physician:  Unk Pinto, MD  CC:  Chronic migraines  HPI:  Heidi Stone is a 56 y.o. female here as a referral from Dr. Melford Aase for migraines. Past medical history of migraines, hyperlipidemia, arthritis and allergies. Patient has had migraines since a child. She has a significant family history of migraines including her mother. For the last year at least patient is had 15 migraine days a month. The migraines are described as pressure behind her eyes, throbbing pounding, severe light sensitivity, nausea and vomiting. Migraines can last up to 24 hours. No medication overuse. No aura. She feels like she has to pull out her eyeball can be very severe. Patient has tried and failed multiple medications and has seen headache specialists in the past. Today we discussed Botox for migraines as well as the new class of migraine medications that were just improved. Meds tried: acetazolamide, amitriptyline, baclofen, frova, propranol, effexor, imitrex (bad reaction), Maxalt.   Reviewed notes, labs and imaging from outside physicians, which showed: MRI of the brain showed a hyperintensity in the left pons otherwise it was normal. I reviewed the report, I do not have the images to review. Interval follow-up of MRI of the brain was recommended.  Review of Systems: Patient complains of symptoms per HPI as well as the following symptoms: Headache, no chest pain, no shortness of breath. Pertinent negatives and positives per HPI. All others negative.   Social History   Social History  . Marital status: Divorced    Spouse name: N/A  . Number of children: 0  . Years of education: BS   Occupational History  . Not on file.   Social History Main Topics  . Smoking status: Never Smoker  . Smokeless tobacco: Never Used  . Alcohol use No     Comment: Occasional  . Drug use: No  . Sexual  activity: Not on file   Other Topics Concern  . Not on file   Social History Narrative   Lives alone   Right-handed   Caffeine: 8 oz per day    Family History  Problem Relation Age of Onset  . Hyperlipidemia Mother   . Hypertension Mother   . Alzheimer's disease Mother   . Hyperlipidemia Father     Past Medical History:  Diagnosis Date  . Allergy   . Arthritis   . Hyperlipidemia   . Migraines     Past Surgical History:  Procedure Laterality Date  . NO PAST SURGERIES      Current Outpatient Prescriptions  Medication Sig Dispense Refill  . ezetimibe (ZETIA) 10 MG tablet TAKE 1 TABLET DAILY FOR CHOLESTEROL 90 tablet 1  . fluticasone (FLONASE) 50 MCG/ACT nasal spray PLACE 1 SPRAY INTO BOTH NOSTRILS DAILY. 48 g 3  . ibuprofen (ADVIL,MOTRIN) 800 MG tablet TAKE 1 TABLET THREE TIMES A DAY WITH FOOD AS NEEDED 90 tablet 3  . rizatriptan (MAXALT-MLT) 10 MG disintegrating tablet DISSOLVE 1 TABLET UNDER TONGUE AS NEEDED FOR MIGRAINE 90 tablet 3  . frovatriptan (FROVA) 2.5 MG tablet Take 1 tablet (2.5 mg total) by mouth as needed for migraine (No more than 2 tablets in 24 hrs). 30 tablet 3   No current facility-administered medications for this visit.     Allergies as of 06/01/2017  . (No Known Allergies)    Vitals: BP 113/63   Pulse 75   Ht 5\' 8"  (1.727 m)  Wt 113 lb 12.8 oz (51.6 kg)   BMI 17.30 kg/m  Last Weight:  Wt Readings from Last 1 Encounters:  06/01/17 113 lb 12.8 oz (51.6 kg)   Last Height:   Ht Readings from Last 1 Encounters:  06/01/17 5\' 8"  (1.727 m)    Physical exam: Exam: Gen: Wearing sunglasses and slight distress due to headache, conversant, well nourised, thin, well groomed                     CV: RRR, no MRG. No Carotid Bruits. No peripheral edema, warm, nontender Eyes: Conjunctivae clear without exudates or hemorrhage  Neuro: Detailed Neurologic Exam  Speech:    Speech is normal; fluent and spontaneous with normal comprehension.    Cognition:    The patient is oriented to person, place, and time;     recent and remote memory intact;     language fluent;     normal attention, concentration,     fund of knowledge Cranial Nerves:    The pupils are equal, round, and reactive to light. Attempted funduscopic exam could not visualize phobia. Visual fields are full to finger confrontation. Extraocular movements are intact. Trigeminal sensation is intact and the muscles of mastication are normal. The face is symmetric. The palate elevates in the midline. Hearing intact. Voice is normal. Shoulder shrug is normal. The tongue has normal motion without fasciculations.   Coordination:    Normal finger to nose and heel to shin. Normal rapid alternating movements.   Gait:    Heel-toe and tandem gait are normal.   Motor Observation:    No asymmetry, no atrophy, and no involuntary movements noted. Tone:    Normal muscle tone.    Posture:    Posture is normal. normal erect    Strength:    Strength is V/V in the upper and lower limbs.      Sensation: intact to LT     Reflex Exam:  DTR's:    Deep tendon reflexes in the upper and lower extremities are normal bilaterally.   Toes:    The toes are downgoing bilaterally.   Clonus:    Clonus is absent.      Assessment/Plan:  This is a 56 year old female with chronic migraines without aura that are intractable without status migrainosus. She has tried and failed multiple medications, she has been treated with other neurologists and an headache clinics. She has not tried Botox for migraine therapy and I do recommend for this patient. I recommend follow-up MRI due to his lesion seen in 2013 patient would like to wait until January.  To prevent or relieve headaches, try the following: Cool Compress. Lie down and place a cool compress on your head.  Avoid headache triggers. If certain foods or odors seem to have triggered your migraines in the past, avoid them. A headache diary  might help you identify triggers.  Include physical activity in your daily routine. Try a daily walk or other moderate aerobic exercise.  Manage stress. Find healthy ways to cope with the stressors, such as delegating tasks on your to-do list.  Practice relaxation techniques. Try deep breathing, yoga, massage and visualization.  Eat regularly. Eating regularly scheduled meals and maintaining a healthy diet might help prevent headaches. Also, drink plenty of fluids.  Follow a regular sleep schedule. Sleep deprivation might contribute to headaches Consider biofeedback. With this mind-body technique, you learn to control certain bodily functions - such as muscle tension, heart rate and blood  pressure - to prevent headaches or reduce headache pain.    Proceed to emergency room if you experience new or worsening symptoms or symptoms do not resolve, if you have new neurologic symptoms or if headache is severe, or for any concerning symptom.   Provided education and documentation from American headache Society toolbox including articles on: chronic migraine medication overuse headache, chronic migraines, prevention of migraines, behavioral and other nonpharmacologic treatments for headache.   Sarina Ill, MD  [facility] [address] [place], [place] [address]  Phone [phone] Fax [phone]

## 2017-06-05 ENCOUNTER — Telehealth: Payer: Self-pay | Admitting: Neurology

## 2017-06-05 NOTE — Telephone Encounter (Signed)
Patient called and requested more information regarding aimovig before she begins botox. Please call and advise.

## 2017-06-06 NOTE — Telephone Encounter (Signed)
I recommend botox, aimovig is too new at this time and botox has been used for 30 years. thanks

## 2017-06-06 NOTE — Telephone Encounter (Signed)
Returned pt call to let her know that Dr. Jaynee Eagles recommends Botox due to its longevity, proven ability to prevent migraines and insurance coverage. She may call back if she has questions.

## 2017-06-19 ENCOUNTER — Telehealth: Payer: Self-pay | Admitting: Neurology

## 2017-06-19 NOTE — Telephone Encounter (Signed)
I recommend she wait to discuss with her headache specialist Dr. Jaynee Eagles when she returns next week. -VRP

## 2017-06-19 NOTE — Telephone Encounter (Signed)
Patient has called office in reference to office visit on 06/01/17.  Patient would like to know if she is able to get altitude medication in the mean time until she has Botox in July.  Pharmacy-  [facility]).  Please call

## 2017-06-20 ENCOUNTER — Other Ambulatory Visit: Payer: Self-pay | Admitting: Internal Medicine

## 2017-06-20 NOTE — Telephone Encounter (Signed)
Spoke with patient and advised her Dr Leta Baptist would like her to discuss concerns with Dr Jaynee Eagles, her headache specialist when she returns next week. Patient verbalized understanding, appreciation for call back.

## 2017-06-29 ENCOUNTER — Other Ambulatory Visit: Payer: Self-pay | Admitting: Neurology

## 2017-06-29 ENCOUNTER — Ambulatory Visit: Payer: 59 | Admitting: Neurology

## 2017-06-29 ENCOUNTER — Telehealth: Payer: Self-pay | Admitting: Neurology

## 2017-06-29 MED ORDER — METHYLPREDNISOLONE 4 MG PO TBPK: ORAL_TABLET | ORAL | 2 refills | Status: DC

## 2017-06-29 NOTE — Telephone Encounter (Signed)
Patient was calling stating she would like to speak to a nurse concerning some medicine.

## 2017-06-29 NOTE — Telephone Encounter (Signed)
Patient was calling and would like for Danielle to call her back.

## 2017-06-29 NOTE — Telephone Encounter (Signed)
Patient called office back. She stated she had a bad migraine when she was in the office last and forgot to ask Dr Jaynee Eagles for a prescription for prednisone. She states this is very effective in helping with her migraines in the past. She has a migraine that started this past Sunday. Got worse Monday, and has gotten worse over time.  She has taken prednisone in the past with no problems. She denies any drug allergies. She has been taking maxalt prn. OTC ineffective.   [id number] [place] [place] I will speak with AA,MD to see if she will approve this and call back to advise.

## 2017-06-29 NOTE — Telephone Encounter (Signed)
Called patient. Advised AA,MD called in rx for her to pharmacy. She verbalized understanding and appreciation for call.

## 2017-06-29 NOTE — Telephone Encounter (Signed)
I returned the patients call but she did not answer. I left her a VM asking her to call me back.

## 2017-06-29 NOTE — Telephone Encounter (Signed)
Thanks I ordered it.

## 2017-06-29 NOTE — Telephone Encounter (Signed)
Called and LVM returning patient's call.

## 2017-07-27 ENCOUNTER — Telehealth: Payer: Self-pay | Admitting: Neurology

## 2017-07-27 DIAGNOSIS — Z0001 Encounter for general adult medical examination with abnormal findings: Secondary | ICD-10-CM

## 2017-07-27 DIAGNOSIS — G43009 Migraine without aura, not intractable, without status migrainosus: Secondary | ICD-10-CM

## 2017-07-27 MED ORDER — RIZATRIPTAN BENZOATE 10 MG PO TBDP: ORAL_TABLET | ORAL | 12 refills | Status: DC

## 2017-07-27 NOTE — Telephone Encounter (Signed)
Her pcp has prescribed previously.  Do you want to do so now?  BN Maxalt DT.

## 2017-07-27 NOTE — Telephone Encounter (Signed)
Renewed per Dr. Cathren Laine  request.

## 2017-07-27 NOTE — Telephone Encounter (Signed)
That's fine, 9 max monthly with 12 refills thanks

## 2017-07-27 NOTE — Telephone Encounter (Signed)
Pt calling for refill of rizatriptan (MAXALT-MLT) 10 MG disintegrating tablet, pt still using   [id number] [place] [place] - [place] [phone] (Phone) [phone] (Fax)

## 2017-07-27 NOTE — Addendum Note (Signed)
Addended by: Oliver Hum S on: 07/27/2017 11:19 AM   Modules accepted: Orders

## 2017-10-02 ENCOUNTER — Telehealth: Payer: Self-pay | Admitting: Neurology

## 2017-10-02 DIAGNOSIS — G43711 Chronic migraine without aura, intractable, with status migrainosus: Secondary | ICD-10-CM

## 2017-10-02 MED ORDER — PREDNISONE 20 MG PO TABS: ORAL_TABLET | ORAL | 0 refills | Status: DC

## 2017-10-02 NOTE — Telephone Encounter (Signed)
Pt called she said the dose of prednisone she took recently in the small dose is not effective. She is wanting RX for 10mg  #[facility]. Please call to discuss

## 2017-10-02 NOTE — Telephone Encounter (Signed)
Pt called wanted to touch base reg botox appt in January

## 2017-10-02 NOTE — Telephone Encounter (Signed)
That's fine, you can order the steroid dosepak as she states thanks

## 2017-10-02 NOTE — Telephone Encounter (Signed)
Called patient, she states she had a Migraine Saturday & Sunday, started a medrol dosepack and is on day #2. She states in the past other MDs have prescribed Prednisone 20 mg #10 tablets, take 1 tablet BID for 3 days, then 1 tablet daily for remainder of 4 days. She states this dose of 4 mg is ineffective. Her migraines have been coming back before she finishes the packs.

## 2017-10-02 NOTE — Telephone Encounter (Signed)
Cancelled previous Medrol Dosepack prescription. Prednisone tapering prescription printed, signed and faxed to pharmacy. Received a receipt of confirmation. Called patient to inform her of new prescription that will replace the one she is currently taking. She verbalized understanding and will call with any problems. Called pharmacy to inform them of change in steroid dose pack.

## 2017-10-02 NOTE — Addendum Note (Signed)
Addended by: Gildardo Griffes on: 10/02/2017 04:44 PM   Modules accepted: Orders

## 2017-10-03 NOTE — Telephone Encounter (Signed)
Patient called back and I spoke with her regarding the injections and questions she had about authorizations. I explained that everything would have to be verified in the new year.

## 2017-10-03 NOTE — Telephone Encounter (Signed)
I called the patient to discuss her botox injections in January, she did not answer so I left a VM asking her to call me back.

## 2017-10-26 ENCOUNTER — Ambulatory Visit: Payer: Self-pay | Admitting: Physician Assistant

## 2017-11-03 ENCOUNTER — Telehealth: Payer: Self-pay | Admitting: Neurology

## 2017-11-03 NOTE — Telephone Encounter (Signed)
Mendel Ryder @ [facility] is asking for a call back from [place] re: Botox for pt, please call back to [phone]

## 2017-11-06 NOTE — Telephone Encounter (Signed)
I called and clarified the prescription.

## 2017-11-08 ENCOUNTER — Telehealth: Payer: Self-pay | Admitting: Neurology

## 2017-11-08 NOTE — Telephone Encounter (Signed)
[facility] [phone] called, botox to ship tomorrow between 10-12 am. She is aware the appt is tomorrow at [facility]

## 2017-11-08 NOTE — Telephone Encounter (Signed)
Noted, thank you

## 2017-11-09 ENCOUNTER — Ambulatory Visit (INDEPENDENT_AMBULATORY_CARE_PROVIDER_SITE_OTHER): Payer: 59 | Admitting: Neurology

## 2017-11-09 ENCOUNTER — Telehealth: Payer: Self-pay | Admitting: Neurology

## 2017-11-09 ENCOUNTER — Encounter: Payer: Self-pay | Admitting: Neurology

## 2017-11-09 VITALS — BP 124/74 | HR 84

## 2017-11-09 DIAGNOSIS — G43711 Chronic migraine without aura, intractable, with status migrainosus: Secondary | ICD-10-CM

## 2017-11-09 NOTE — Telephone Encounter (Signed)
Pt. Needs 12 wk botox apt

## 2017-11-09 NOTE — Progress Notes (Signed)
Botox- 100 units x 2 vials Lot: [id number] Expiration: 12/2019 NDC: 0388-8280-03  Bacteriostatic 0.9% Sodium Chloride- 77mL total Lot: [id number] Expiration: 03/01/2019 NDC: 5056-9794-80  Dx: X65.537 S/P OR B/B //BCrn

## 2017-11-10 ENCOUNTER — Telehealth: Payer: Self-pay | Admitting: Neurology

## 2017-11-10 NOTE — Telephone Encounter (Signed)
Pt has called and is asking that the powder medication she was given to take with water while here is office on 01-10, she would like a prescription for it in addition to a reminder of RN Bethany calling in a refill prescription of  predniSONE (DELTASONE) 20 MG tablet [facility]  [id number] [place] [place] [phone] (Phone) [phone] (Fax)

## 2017-11-12 ENCOUNTER — Other Ambulatory Visit: Payer: Self-pay | Admitting: Neurology

## 2017-11-12 ENCOUNTER — Encounter: Payer: Self-pay | Admitting: Neurology

## 2017-11-12 DIAGNOSIS — G43711 Chronic migraine without aura, intractable, with status migrainosus: Secondary | ICD-10-CM

## 2017-11-12 MED ORDER — PREDNISONE 20 MG PO TABS: ORAL_TABLET | ORAL | 3 refills | Status: DC

## 2017-11-13 ENCOUNTER — Telehealth: Payer: Self-pay | Admitting: Neurology

## 2017-11-13 MED ORDER — DICLOFENAC POTASSIUM(MIGRAINE) 50 MG PO PACK: 1.0000 | PACK | Freq: Two times a day (BID) | ORAL | 11 refills | Status: DC | PRN

## 2017-11-13 NOTE — Telephone Encounter (Signed)
Patient wants to speak with the nurse regarding a medication Dr. Jaynee Eagles had given her samples of. Please call and advise.

## 2017-11-13 NOTE — Telephone Encounter (Addendum)
Cambia prescribed to [facility]. Verbal order from Dr. Jaynee Eagles.   Called and spoke with the patient. She is aware that Cathren Harsh has been sent to [facility] and she should be receiving a call from them soon. She is also aware that [facility] and that Dr. Jaynee Eagles had sent her an email through [place]. She verbalized appreciation.

## 2017-11-23 ENCOUNTER — Telehealth: Payer: Self-pay | Admitting: *Deleted

## 2017-11-23 NOTE — Telephone Encounter (Signed)
Called [facility] spoke with [facility]. Confirmed they can do PA for Cambia. Need office notes & Dx code including tried/failed meds.   Faxed office notes and supporting information to [place]. Received a receipt of confirmation.

## 2017-12-03 ENCOUNTER — Other Ambulatory Visit: Payer: Self-pay | Admitting: Physician Assistant

## 2017-12-03 DIAGNOSIS — Z0001 Encounter for general adult medical examination with abnormal findings: Secondary | ICD-10-CM

## 2017-12-03 DIAGNOSIS — E785 Hyperlipidemia, unspecified: Secondary | ICD-10-CM

## 2018-01-29 ENCOUNTER — Telehealth: Payer: Self-pay | Admitting: Neurology

## 2018-01-29 NOTE — Telephone Encounter (Signed)
Patient called and requested to know if she needed to stay on a schedule for her botox injections. She is very busy right now and would like to push her apt out at least a month. I told her that it is important that she stay on the schedule. She decided to keep her current apt.

## 2018-01-31 NOTE — Telephone Encounter (Signed)
Patient called back to inform me she has given consent to have the medication shipped.

## 2018-02-08 ENCOUNTER — Encounter: Payer: Self-pay | Admitting: Neurology

## 2018-02-08 ENCOUNTER — Ambulatory Visit: Payer: 59 | Admitting: Neurology

## 2018-02-08 VITALS — BP 142/72 | HR 78 | Ht 64.0 in | Wt 105.0 lb

## 2018-02-08 DIAGNOSIS — G43711 Chronic migraine without aura, intractable, with status migrainosus: Secondary | ICD-10-CM | POA: Diagnosis not present

## 2018-02-08 NOTE — Progress Notes (Signed)
Botox- 100 units x 2 vials Lot:  [id number] Expiration: 07/2020 NDC: 2353-6144-31  Bacteriostatic 0.9% Sodium Chloride- 36mL total Lot: [id number] Expiration: 03/01/2019 NDC: 7619-5093-26  Dx: [id number] S/P //BCrn

## 2018-02-08 NOTE — Telephone Encounter (Signed)
Per [facility] portal, pt's Cambia 50 mg # 4 was delivered [facility] 1/30 w/ $20 copay.

## 2018-02-08 NOTE — Progress Notes (Signed)
She hasn't missed any work since botox. Her severity is >>50% improved. +masseters, + eyes, +very light on the neck muscles bc had some cramping, temples    Consent Form Botulism Toxin Injection For Chronic Migraine    Reviewed orally with patient, additionally signature is on file:  Botulism toxin has been approved by the Federal drug administration for treatment of chronic migraine. Botulism toxin does not cure chronic migraine and it may not be effective in some patients.  The administration of botulism toxin is accomplished by injecting a small amount of toxin into the muscles of the neck and head. Dosage must be titrated for each individual. Any benefits resulting from botulism toxin tend to wear off after 3 months with a repeat injection required if benefit is to be maintained. Injections are usually done every 3-4 months with maximum effect peak achieved by about 2 or 3 weeks. Botulism toxin is expensive and you should be sure of what costs you will incur resulting from the injection.  The side effects of botulism toxin use for chronic migraine may include:   -Transient, and usually mild, facial weakness with facial injections  -Transient, and usually mild, head or neck weakness with head/neck injections  -Reduction or loss of forehead facial animation due to forehead muscle weakness  -Eyelid drooping  -Dry eye  -Pain at the site of injection or bruising at the site of injection  -Double vision  -Potential unknown long term risks  Contraindications: You should not have Botox if you are pregnant, nursing, allergic to albumin, have an infection, skin condition, or muscle weakness at the site of the injection, or have myasthenia gravis, Lambert-Eaton syndrome, or ALS.  It is also possible that as with any injection, there may be an allergic reaction or no effect from the medication. Reduced effectiveness after repeated injections is sometimes seen and rarely infection at the injection  site may occur. All care will be taken to prevent these side effects. If therapy is given over a long time, atrophy and wasting in the muscle injected may occur. Occasionally the patient's become refractory to treatment because they develop antibodies to the toxin. In this event, therapy needs to be modified.  I have read the above information and consent to the administration of botulism toxin.    BOTOX PROCEDURE NOTE FOR MIGRAINE HEADACHE    Contraindications and precautions discussed with patient(above). Aseptic procedure was observed and patient tolerated procedure. Procedure performed by Dr. Georgia Dom  The condition has existed for more than 6 months, and pt does not have a diagnosis of ALS, Myasthenia Gravis or Lambert-Eaton Syndrome.  Risks and benefits of injections discussed and pt agrees to proceed with the procedure.  Written consent obtained  These injections are medically necessary. Pt  receives good benefits from these injections. These injections do not cause sedations or hallucinations which the oral therapies may cause.  Indication/Diagnosis: chronic migraine BOTOX(J0585) injection was performed according to protocol by [employer]. 200 units of BOTOX was dissolved into 4 cc NS.   NDC: [address]-80   Description of procedure:  The patient was placed in a sitting position. The standard protocol was used for Botox as follows, with 5 units of Botox injected at each site:   -Procerus muscle, midline injection  -Corrugator muscle, bilateral injection  -Frontalis muscle, bilateral injection, with 2 sites each side, medial injection was performed in the upper one third of the frontalis muscle, in the region vertical from the medial inferior edge of the superior orbital  rim. The lateral injection was again in the upper one third of the forehead vertically above the lateral limbus of the cornea, 1.5 cm lateral to the medial injection site.  -Temporalis muscle injection, 4  sites, bilaterally. The first injection was 3 cm above the tragus of the ear, second injection site was 1.5 cm to 3 cm up from the first injection site in line with the tragus of the ear. The third injection site was 1.5-3 cm forward between the first 2 injection sites. The fourth injection site was 1.5 cm posterior to the second injection site.  -Occipitalis muscle injection, 3 sites, bilaterally. The first injection was done one half way between the occipital protuberance and the tip of the mastoid process behind the ear. The second injection site was done lateral and superior to the first, 1 fingerbreadth from the first injection. The third injection site was 1 fingerbreadth superiorly and medially from the first injection site.  -Cervical paraspinal muscle injection, 2 sites, bilateral knee first injection site was 1 cm from the midline of the cervical spine, 3 cm inferior to the lower border of the occipital protuberance. The second injection site was 1.5 cm superiorly and laterally to the first injection site.  -Trapezius muscle injection was performed at 3 sites, bilaterally. The first injection site was in the upper trapezius muscle halfway between the inflection point of the neck, and the acromion. The second injection site was one half way between the acromion and the first injection site. The third injection was done between the first injection site and the inflection point of the neck.   Will return for repeat injection in 3 months.   A 200 unit sof Botox was used, 155 units were injected, the rest of the Botox was wasted. The patient tolerated the procedure well, there were no complications of the above procedure.

## 2018-04-09 ENCOUNTER — Telehealth: Payer: Self-pay | Admitting: Neurology

## 2018-04-09 NOTE — Telephone Encounter (Signed)
Faxed signed Botox PA form to [facility] to [phone]. Dr. Cathren Laine  Patient. Conformation and Form in [facility].

## 2018-04-24 NOTE — Progress Notes (Signed)
Complete Physical  Assessment and Plan:  Encounter for general adult medical examination with abnormal findings Patient will schedule MGM - breast exam limited due to extremely fibrous breasts, will do cologuard  Migraine without aura and without status migrainosus, not intractable Avoid triggers, continue medications Getting botox injections -  Followed by neurology  Arthritis monitor  Allergic state, subsequent encounter Continue OTC allergy pills  Hyperlipidemia, unspecified hyperlipidemia type Continue medications: zetia Continue low cholesterol diet and exercise.  Check lipid panel.   Other abnormal glucose Discussed general issues about diabetes pathophysiology and management., Educational material distributed., Suggested low cholesterol diet., Encouraged aerobic exercise., Discussed foot care., Reminded to get yearly retinal exam. - check A1C  Vitamin D deficiency Continue supplementation; discussed goal 70-100 Check vitamin D level -     VITAMIN D 25 Hydroxy (Vit-D Deficiency, Fractures)  BMI <19/fatigue/uninentional weight loss Overdue for various CA screening; emphasized mammogram, PAP done today, will do cologuard No other specific symptoms Consider [place] or CT abd/pelvis if weight loss continues  Orders Placed This Encounter  Procedures  . CBC with Differential/Platelet  . COMPLETE METABOLIC PANEL WITH GFR  . Lipid panel  . TSH  . Hemoglobin A1c  . Urinalysis w microscopic + reflex cultur  . Microalbumin / creatinine urine ratio  . Cologuard     Discussed med's effects and SE's. Screening labs and tests as requested with regular follow-up as recommended.  Future Appointments  Date Time Provider [place]  05/17/2018  3:30 PM Melvenia Beam, MD [facility] None  04/30/2019 10:00 AM Liane Comber, NP [facility] None     HPI  57 y.o. female  presents for a complete physical. She has Allergy; Hyperlipidemia; Arthritis; Migraines; and Other  abnormal glucose on their problem list.  She has a history of migraines, she has been to Dr. Melton Alar and the headache clinic without help, has OV to go see Dr. Lavell Anchors for headaches for botox with significant improvement. She reports she still gets headaches, but are much easier to treat/resolve. Having 7 days/month down from 21/month.   BMI is Body mass index is 17.96 kg/m., she has not been working on diet and exercise, endorses has been fatigued. Wt Readings from Last 3 Encounters:  04/26/18 103 lb (46.7 kg)  02/08/18 105 lb (47.6 kg)  06/01/17 113 lb 12.8 oz (51.6 kg)    Her blood pressure has been controlled at home, today their BP is BP: 110/70 She does not workout. She denies chest pain, shortness of breath, dizziness.   She is on cholesterol medication, zetia every day and denies myalgias. Her cholesterol is at goal. The cholesterol last visit was:   Lab Results  Component Value Date   CHOL 193 04/24/2017   HDL 45 (L) 04/24/2017   LDLCALC 130 (H) 04/24/2017   TRIG 89 04/24/2017   CHOLHDL 4.3 04/24/2017    Last A1C in the office was:  Lab Results  Component Value Date   HGBA1C 5.5 04/24/2017  .  Patient is on Vitamin D supplement, she is on 2000 IU daily, requests we defer checking this today Lab Results  Component Value Date   VD25OH 89 04/24/2017      Current Medications:  Current Outpatient Medications on File Prior to Visit  Medication Sig Dispense Refill  . ezetimibe (ZETIA) 10 MG tablet TAKE 1 TABLET DAILY FOR CHOLESTEROL 90 tablet 1  . fluticasone (FLONASE) 50 MCG/ACT nasal spray PLACE 1 SPRAY INTO BOTH NOSTRILS DAILY. 48 g 3  . ibuprofen (  ADVIL,MOTRIN) 800 MG tablet TAKE 1 TABLET THREE TIMES A DAY WITH FOOD AS NEEDED 90 tablet 3  . OnabotulinumtoxinA (BOTOX IJ) Inject as directed. Injects every three months    . predniSONE (DELTASONE) 20 MG tablet 10 day tapering course. Take 1 tab twice a day for 3 days, then take 1 tab by mouth once a day for the remaining 4  days. Take with food. 10 tablet 3  . rizatriptan (MAXALT-MLT) 10 MG disintegrating tablet DISSOLVE 1 TABLET UNDER TONGUE AS NEEDED FOR MIGRAINE 9 tablet 12  . frovatriptan (FROVA) 2.5 MG tablet Take 1 tablet (2.5 mg total) by mouth as needed for migraine (No more than 2 tablets in 24 hrs). 30 tablet 3   No current facility-administered medications on file prior to visit.    Health Maintenance:   Immunization History  Administered Date(s) Administered  . Influenza Split 08/28/2013, 08/28/2014, 07/29/2015  . Td 07/12/2010  . Tdap 07/29/2015   Tetanus:2016 Pneumovax: N/A Flu vaccine: 2015 Zostavax: N/A  Pap: 07/2013 never abnormal pap due 2019 - done today MGM: 04/2016, DUE, CAT D - discussed, patient will schedule DEXA: no family history, on estrogen, Vitamin D due age 66 Colonoscopy: overdue, won't do colonoscopy, will do cologuard EGD:  Last Dental Exam: Dr. Pollyann Savoy, 10/2017 Last Eye Exam: Dr. [place], 2018, wears glasses DERM- Amy Martinique  Patient Care Team: Unk Pinto, MD as PCP - General (Internal Medicine)  Allergies: No Known Allergies Medical History:  Past Medical History:  Diagnosis Date  . Allergy   . Arthritis   . Hyperlipidemia   . Migraines    Surgical History:  Past Surgical History:  Procedure Laterality Date  . NO PAST SURGERIES     Family History:  Patient's  family history includes Alzheimer's disease in her mother; Hyperlipidemia in her father and mother; Hypertension in her mother. Social History:  She  reports that she has never smoked. She has never used smokeless tobacco. She reports that she does not drink alcohol or use drugs.  Review of Systems  Constitutional: Positive for malaise/fatigue and weight loss. Negative for chills and fever.  HENT: Negative for congestion, ear pain, hearing loss, sore throat and tinnitus.   Eyes: Negative.  Negative for blurred vision and double vision.  Respiratory: Negative for cough, sputum  production, shortness of breath and wheezing.   Cardiovascular: Negative for chest pain, palpitations, orthopnea, claudication, leg swelling and PND.  Gastrointestinal: Negative for abdominal pain, blood in stool, constipation, diarrhea, heartburn, melena, nausea and vomiting.  Genitourinary: Negative.   Musculoskeletal: Negative for falls, joint pain and myalgias.  Skin: Negative for rash.  Neurological: Positive for headaches. Negative for dizziness, tingling, sensory change, loss of consciousness and weakness.  Endo/Heme/Allergies: Negative for polydipsia.  Psychiatric/Behavioral: Negative.  Negative for depression, memory loss, substance abuse and suicidal ideas. The patient is not nervous/anxious and does not have insomnia.   All other systems reviewed and are negative.   Physical Exam: Estimated body mass index is 17.96 kg/m as calculated from the following:   Height as of this encounter: 5' 3.5" (1.613 m).   Weight as of this encounter: 103 lb (46.7 kg). BP 110/70   Pulse 78   Temp 98.1 F (36.7 C)   Ht 5' 3.5" (1.613 m)   Wt 103 lb (46.7 kg)   SpO2 99%   BMI 17.96 kg/m  General Appearance: Well nourished, in no apparent distress.  Eyes: PERRLA, EOMs, conjunctiva no swelling or erythema, normal fundi and vessels.  Sinuses: No Frontal/maxillary tenderness  ENT/Mouth: Ext aud canals clear, normal light reflex with TMs without erythema, bulging. Good dentition. No erythema, swelling, or exudate on post pharynx. Tonsils not swollen or erythematous. Hearing normal. Neck: Supple, thyroid normal. No bruits  Respiratory: Respiratory effort normal, BS equal bilaterally without rales, rhonchi, wheezing or stridor.  Cardio: RRR without murmurs, rubs or gallops. Brisk peripheral pulses without edema.  Chest: symmetric, with normal excursions and percussion.  Breasts: Symmetric, without nipple discharge, retractions. Very fibrous/irregular throughout limiting exam -  Abdomen: Firm  throughout/patient unable to relax, nontender, no guarding, rebound, palpable hernias, masses, or organomegaly. .  Lymphatics: Non tender without lymphadenopathy.  Genitourinary: Very small introitus/vaginal canal; internal and external structures WNL without lesions, discharge, no palpable abnormalities to bimanual exam; cervix shifted to left, difficult to access Musculoskeletal: Full ROM all peripheral extremities, 5/5 strength, and normal gait.  Skin: Warm, dry without rashes, lesions, ecchymosis. Neuro: Cranial nerves intact, reflexes equal bilaterally. Normal muscle tone, no cerebellar symptoms. Sensation intact.  Psych: Awake and oriented X 3, normal affect, Insight and Judgment appropriate.   EKG:  Normal in 2018, no changes, defer   Heidi Stone 10:08 AM [facility]

## 2018-04-25 ENCOUNTER — Encounter: Payer: Self-pay | Admitting: Physician Assistant

## 2018-04-26 ENCOUNTER — Other Ambulatory Visit (HOSPITAL_COMMUNITY)
Admission: RE | Admit: 2018-04-26 | Discharge: 2018-04-26 | Disposition: A | Discharge status: Home / Self Care | Payer: 59 | Source: Ambulatory Visit | Attending: Adult Health | Admitting: Adult Health

## 2018-04-26 ENCOUNTER — Other Ambulatory Visit: Payer: Self-pay | Admitting: Adult Health

## 2018-04-26 ENCOUNTER — Ambulatory Visit (INDEPENDENT_AMBULATORY_CARE_PROVIDER_SITE_OTHER): Payer: 59 | Admitting: Adult Health

## 2018-04-26 ENCOUNTER — Other Ambulatory Visit: Payer: Self-pay | Admitting: Physician Assistant

## 2018-04-26 ENCOUNTER — Encounter: Payer: Self-pay | Admitting: Adult Health

## 2018-04-26 ENCOUNTER — Encounter: Payer: Self-pay | Admitting: Physician Assistant

## 2018-04-26 VITALS — BP 110/70 | HR 78 | Temp 98.1°F | Ht 63.5 in | Wt 103.0 lb

## 2018-04-26 DIAGNOSIS — Z1211 Encounter for screening for malignant neoplasm of colon: Secondary | ICD-10-CM

## 2018-04-26 DIAGNOSIS — Z131 Encounter for screening for diabetes mellitus: Secondary | ICD-10-CM | POA: Diagnosis not present

## 2018-04-26 DIAGNOSIS — G43009 Migraine without aura, not intractable, without status migrainosus: Secondary | ICD-10-CM

## 2018-04-26 DIAGNOSIS — Z124 Encounter for screening for malignant neoplasm of cervix: Secondary | ICD-10-CM

## 2018-04-26 DIAGNOSIS — Z0000 Encounter for general adult medical examination without abnormal findings: Secondary | ICD-10-CM | POA: Diagnosis not present

## 2018-04-26 DIAGNOSIS — M1990 Unspecified osteoarthritis, unspecified site: Secondary | ICD-10-CM

## 2018-04-26 DIAGNOSIS — E559 Vitamin D deficiency, unspecified: Secondary | ICD-10-CM

## 2018-04-26 DIAGNOSIS — E785 Hyperlipidemia, unspecified: Secondary | ICD-10-CM | POA: Diagnosis not present

## 2018-04-26 DIAGNOSIS — Z681 Body mass index (BMI) 19 or less, adult: Secondary | ICD-10-CM

## 2018-04-26 DIAGNOSIS — T7840XD Allergy, unspecified, subsequent encounter: Secondary | ICD-10-CM

## 2018-04-26 DIAGNOSIS — R7309 Other abnormal glucose: Secondary | ICD-10-CM

## 2018-04-26 DIAGNOSIS — R634 Abnormal weight loss: Secondary | ICD-10-CM

## 2018-04-26 DIAGNOSIS — Z1389 Encounter for screening for other disorder: Secondary | ICD-10-CM

## 2018-04-26 DIAGNOSIS — Z1231 Encounter for screening mammogram for malignant neoplasm of breast: Secondary | ICD-10-CM

## 2018-04-26 DIAGNOSIS — Z79899 Other long term (current) drug therapy: Secondary | ICD-10-CM | POA: Diagnosis not present

## 2018-04-26 DIAGNOSIS — Z13220 Encounter for screening for lipoid disorders: Secondary | ICD-10-CM | POA: Diagnosis not present

## 2018-04-26 NOTE — Patient Instructions (Addendum)
[facility]  7 a.m.-6:30 p.m., Monday 7 a.m.-5 p.m., Tuesday-Friday Schedule an appointment by calling [phone].  [facility] Schedule an appointment by calling [phone].     Cologuard is an easy to use noninvasive colon cancer screening test based on the latest advances in stool DNA science.   Colon cancer is 3rd most diagnosed cancer and 2nd leading cause of death in both men and women 59 years of age and older despite being one of the most preventable and treatable cancers if found early.  4 of out 5 people diagnosed with colon cancer have NO prior family history.  When caught EARLY 90% of colon cancer is curable.   You have agreed to do a Cologuard screening and have declined a colonoscopy in spite of being explained the risks and benefits of the colonoscopy in detail, including cancer and death. Please understand that this is test not as sensitive or specific as a colonoscopy and you are still recommended to get a colonoscopy.   If you are NOT medicare please call your insurance company and give them these items to see if they will cover it: 1) CPT code, [phone] 2) Provider is Probation officer 3) [employer] NPI [phone] 4) [place] Tax ID [phone]  Out-of-pocket cost for Cologuard can range from $0 - $649 so please call  You will receive a short call from [place] support center at [employer], when you receive a call they will say they are from [place],  to confirm your mailing address and give you more information.  When they calll you, it will appear on the caller ID as "[employer]" or in some cases only this number will appear, [phone].   [employer] will ship your collection kit directly to you. You will collect a single stool sample in the privacy of your own home, no special preparation required. You will return the kit via [place] pre-paid shipping or  pick-up, in the same box it arrived in. Then I will contact you to discuss your results after I receive them from the laboratory.   If you have any questions or concerns, Cologuard Customer Support Specialist are available 24 hours a day, 7 days a week at [phone] or go to [email or web address].          Aim for 7+ servings of fruits and vegetables daily  80+ fluid ounces of water or unsweet tea for healthy kidneys  Limit to 1 drink of alcohol per day, avoid smoking  Limit animal fats in diet for cholesterol and heart health - choose grass fed whenever available  Aim for low stress - take time to unwind and care for your mental health  Aim for 150 min of moderate intensity exercise weekly for heart health, and weights twice weekly for bone health  Aim for 7-9 hours of sleep daily      When it comes to diets, agreement about the perfect plan isn't easy to find, even among the experts. Experts at the [employer] developed an idea known as the Healthy Eating Plate. Just imagine a plate divided into logical, healthy portions.  The emphasis is on diet quality:  Load up on vegetables and fruits - one-half of your plate: Aim for color and variety, and remember that potatoes don't count.  Go for whole grains - one-quarter of your plate: Whole wheat, barley, wheat berries, quinoa, oats, brown rice, and foods made with them. If  you want pasta, go with whole wheat pasta.  Protein power - one-quarter of your plate: Fish, chicken, beans, and nuts are all healthy, versatile protein sources. Limit red meat.  The diet, however, does go beyond the plate, offering a few other suggestions.  Use healthy plant oils, such as olive, canola, soy, corn, sunflower and peanut. Check the labels, and avoid partially hydrogenated oil, which have unhealthy trans fats.  If you're thirsty, drink water. Coffee and tea are good in moderation, but skip sugary drinks and limit milk  and dairy products to one or two daily servings.  The type of carbohydrate in the diet is more important than the amount. Some sources of carbohydrates, such as vegetables, fruits, whole grains, and beans-are healthier than others.  Finally, stay active.

## 2018-04-27 LAB — CBC WITH DIFFERENTIAL/PLATELET
Basophils Absolute: 77 cells/uL (ref 0–200)
Basophils Relative: 1.4 %
Eosinophils Absolute: 132 cells/uL (ref 15–500)
Eosinophils Relative: 2.4 %
HCT: 38.7 % (ref 35.0–45.0)
Hemoglobin: 13.3 g/dL (ref 11.7–15.5)
Lymphs Abs: 1716 cells/uL (ref 850–3900)
MCH: 29.9 pg (ref 27.0–33.0)
MCHC: 34.4 g/dL (ref 32.0–36.0)
MCV: 87 fL (ref 80.0–100.0)
MPV: 11.2 fL (ref 7.5–12.5)
Monocytes Relative: 8.2 %
Neutro Abs: 3124 cells/uL (ref 1500–7800)
Neutrophils Relative %: 56.8 %
PLATELETS: 243 10*3/uL (ref 140–400)
RBC: 4.45 10*6/uL (ref 3.80–5.10)
RDW: 13 % (ref 11.0–15.0)
Total Lymphocyte: 31.2 %
WBC: 5.5 10*3/uL (ref 3.8–10.8)
WBCMIX: 451 {cells}/uL (ref 200–950)

## 2018-04-27 LAB — URINALYSIS W MICROSCOPIC + REFLEX CULTURE
BILIRUBIN URINE: NEGATIVE
Bacteria, UA: NONE SEEN /HPF
GLUCOSE, UA: NEGATIVE
Hgb urine dipstick: NEGATIVE
Hyaline Cast: NONE SEEN /LPF
KETONES UR: NEGATIVE
LEUKOCYTE ESTERASE: NEGATIVE
NITRITES URINE, INITIAL: NEGATIVE
PROTEIN: NEGATIVE
RBC / HPF: NONE SEEN /HPF (ref 0–2)
Specific Gravity, Urine: 1.016 (ref 1.001–1.03)
Squamous Epithelial / LPF: NONE SEEN /HPF (ref <=5)
WBC, UA: NONE SEEN /HPF (ref 0–5)
pH: 6.5 (ref 5.0–8.0)

## 2018-04-27 LAB — LIPID PANEL
CHOL/HDL RATIO: 2.9 (calc) (ref <5.0)
CHOLESTEROL: 202 mg/dL — AB (ref <200)
HDL: 69 mg/dL (ref >50)
LDL Cholesterol (Calc): 110 mg/dL (calc) — ABNORMAL HIGH
Non-HDL Cholesterol (Calc): 133 mg/dL (calc) — ABNORMAL HIGH (ref <130)
Triglycerides: 121 mg/dL (ref <150)

## 2018-04-27 LAB — HEMOGLOBIN A1C
HEMOGLOBIN A1C: 5.6 %{Hb} (ref <5.7)
Mean Plasma Glucose: 114 (calc)
eAG (mmol/L): 6.3 (calc)

## 2018-04-27 LAB — COMPLETE METABOLIC PANEL WITH GFR
AG Ratio: 1.8 (calc) (ref 1.0–2.5)
ALBUMIN MSPROF: 4.4 g/dL (ref 3.6–5.1)
ALKALINE PHOSPHATASE (APISO): 111 U/L (ref 33–130)
ALT: 22 U/L (ref 6–29)
AST: 22 U/L (ref 10–35)
BILIRUBIN TOTAL: 0.5 mg/dL (ref 0.2–1.2)
BUN: 24 mg/dL (ref 7–25)
CHLORIDE: 104 mmol/L (ref 98–110)
CO2: 30 mmol/L (ref 20–32)
Calcium: 9.6 mg/dL (ref 8.6–10.4)
Creat: 0.92 mg/dL (ref 0.50–1.05)
GFR, EST AFRICAN AMERICAN: 79 mL/min/{1.73_m2} (ref >=60)
GFR, Est Non African American: 68 mL/min/{1.73_m2} (ref >=60)
GLOBULIN: 2.4 g/dL (ref 1.9–3.7)
Glucose, Bld: 89 mg/dL (ref 65–99)
Potassium: 4.1 mmol/L (ref 3.5–5.3)
SODIUM: 143 mmol/L (ref 135–146)
Total Protein: 6.8 g/dL (ref 6.1–8.1)

## 2018-04-27 LAB — TSH: TSH: 2.46 m[IU]/L (ref 0.40–4.50)

## 2018-04-27 LAB — NO CULTURE INDICATED

## 2018-04-27 LAB — MICROALBUMIN / CREATININE URINE RATIO
Creatinine, Urine: 48 mg/dL (ref 20–275)
Microalb Creat Ratio: 29 mcg/mg creat (ref <30)
Microalb, Ur: 1.4 mg/dL

## 2018-04-30 NOTE — Telephone Encounter (Signed)
I called and scheduled delivery for 05/09/18. DW

## 2018-04-30 NOTE — Telephone Encounter (Signed)
I called [facility] to check status of the patients Botox medication but [facility] phone lines were down. I will call back at a later time.

## 2018-05-04 ENCOUNTER — Other Ambulatory Visit: Payer: Self-pay | Admitting: Adult Health

## 2018-05-04 DIAGNOSIS — Z124 Encounter for screening for malignant neoplasm of cervix: Secondary | ICD-10-CM

## 2018-05-04 LAB — CYTOLOGY - PAP: HPV: NOT DETECTED

## 2018-05-08 NOTE — Progress Notes (Signed)
Patient called back and is planning on completing the Cologuard after she returns from vacation which will be in about 1.5 weeks.

## 2018-05-08 NOTE — Progress Notes (Signed)
LMTCB

## 2018-05-17 ENCOUNTER — Ambulatory Visit: Payer: 59 | Admitting: Neurology

## 2018-05-17 ENCOUNTER — Ambulatory Visit: Payer: 59

## 2018-05-17 DIAGNOSIS — G43711 Chronic migraine without aura, intractable, with status migrainosus: Secondary | ICD-10-CM | POA: Diagnosis not present

## 2018-05-17 NOTE — Progress Notes (Signed)
Botox- 100 units x 2 vials Lot: [id number] Expiration: 10/2020 NDC: 4158-3094-07  Bacteriostatic 0.9% Sodium Chloride- 4mL total Lot: [id number] Expiration: 03/01/2019 NDC: 1031-5945-85  Dx: F29.244 S/P

## 2018-05-17 NOTE — Progress Notes (Signed)
She hasn't missed any work since botox. Her severity is >>60% improved. +masseters, + eyes, NOT on the neck muscles bc had some cramping, temples. Baseline is 15 migraine days a month. The migraines are described as pressure behind her eyes, throbbing pounding, severe light sensitivity, nausea and vomiting. Migraines can last up to 24 hours. She is not on cgrp.  She has 6 headache days a month and they are significantly improved in severity. She feels very well controlled on botox.  Needs numbing cream on the forehead.     Consent Form Botulism Toxin Injection For Chronic Migraine    Reviewed orally with patient, additionally signature is on file:  Botulism toxin has been approved by the Federal drug administration for treatment of chronic migraine. Botulism toxin does not cure chronic migraine and it may not be effective in some patients.  The administration of botulism toxin is accomplished by injecting a small amount of toxin into the muscles of the neck and head. Dosage must be titrated for each individual. Any benefits resulting from botulism toxin tend to wear off after 3 months with a repeat injection required if benefit is to be maintained. Injections are usually done every 3-4 months with maximum effect peak achieved by about 2 or 3 weeks. Botulism toxin is expensive and you should be sure of what costs you will incur resulting from the injection.  The side effects of botulism toxin use for chronic migraine may include:   -Transient, and usually mild, facial weakness with facial injections  -Transient, and usually mild, head or neck weakness with head/neck injections  -Reduction or loss of forehead facial animation due to forehead muscle weakness  -Eyelid drooping  -Dry eye  -Pain at the site of injection or bruising at the site of injection  -Double vision  -Potential unknown long term risks  Contraindications: You should not have Botox if you are pregnant, nursing, allergic to  albumin, have an infection, skin condition, or muscle weakness at the site of the injection, or have myasthenia gravis, Lambert-Eaton syndrome, or ALS.  It is also possible that as with any injection, there may be an allergic reaction or no effect from the medication. Reduced effectiveness after repeated injections is sometimes seen and rarely infection at the injection site may occur. All care will be taken to prevent these side effects. If therapy is given over a long time, atrophy and wasting in the muscle injected may occur. Occasionally the patient's become refractory to treatment because they develop antibodies to the toxin. In this event, therapy needs to be modified.  I have read the above information and consent to the administration of botulism toxin.    BOTOX PROCEDURE NOTE FOR MIGRAINE HEADACHE    Contraindications and precautions discussed with patient(above). Aseptic procedure was observed and patient tolerated procedure. Procedure performed by Dr. Georgia Dom  The condition has existed for more than 6 months, and pt does not have a diagnosis of ALS, Myasthenia Gravis or Lambert-Eaton Syndrome.  Risks and benefits of injections discussed and pt agrees to proceed with the procedure.  Written consent obtained  These injections are medically necessary. Pt  receives good benefits from these injections. These injections do not cause sedations or hallucinations which the oral therapies may cause.  Indication/Diagnosis: chronic migraine BOTOX(J0585) injection was performed according to protocol by [employer]. 200 units of BOTOX was dissolved into 4 cc NS.   NDC: [address]-09   Description of procedure:  The patient was placed in a sitting position.  The standard protocol was used for Botox as follows, with 5 units of Botox injected at each site:   -Procerus muscle, midline injection  -Corrugator muscle, bilateral injection  -Frontalis muscle, bilateral injection, with 2 sites each  side, medial injection was performed in the upper one third of the frontalis muscle, in the region vertical from the medial inferior edge of the superior orbital rim. The lateral injection was again in the upper one third of the forehead vertically above the lateral limbus of the cornea, 1.5 cm lateral to the medial injection site.  -Temporalis muscle injection, 4 sites, bilaterally. The first injection was 3 cm above the tragus of the ear, second injection site was 1.5 cm to 3 cm up from the first injection site in line with the tragus of the ear. The third injection site was 1.5-3 cm forward between the first 2 injection sites. The fourth injection site was 1.5 cm posterior to the second injection site.  -Occipitalis muscle injection, 3 sites, bilaterally. The first injection was done one half way between the occipital protuberance and the tip of the mastoid process behind the ear. The second injection site was done lateral and superior to the first, 1 fingerbreadth from the first injection. The third injection site was 1 fingerbreadth superiorly and medially from the first injection site.  -Cervical paraspinal muscle injection, 2 sites, bilateral knee first injection site was 1 cm from the midline of the cervical spine, 3 cm inferior to the lower border of the occipital protuberance. The second injection site was 1.5 cm superiorly and laterally to the first injection site.  -Trapezius muscle injection was performed at 3 sites, bilaterally. The first injection site was in the upper trapezius muscle halfway between the inflection point of the neck, and the acromion. The second injection site was one half way between the acromion and the first injection site. The third injection was done between the first injection site and the inflection point of the neck.   Will return for repeat injection in 3 months.   A 200 unit sof Botox was used, 155 units were injected, the rest of the Botox was wasted. The  patient tolerated the procedure well, there were no complications of the above procedure.

## 2018-05-18 ENCOUNTER — Encounter (INDEPENDENT_AMBULATORY_CARE_PROVIDER_SITE_OTHER): Payer: Self-pay

## 2018-06-05 ENCOUNTER — Ambulatory Visit
Admission: RE | Admit: 2018-06-05 | Discharge: 2018-06-05 | Disposition: A | Discharge status: Home / Self Care | Payer: 59 | Source: Ambulatory Visit | Attending: Adult Health | Admitting: Adult Health

## 2018-06-05 DIAGNOSIS — Z1231 Encounter for screening mammogram for malignant neoplasm of breast: Secondary | ICD-10-CM | POA: Diagnosis not present

## 2018-06-06 ENCOUNTER — Other Ambulatory Visit: Payer: Self-pay | Admitting: Adult Health

## 2018-06-06 DIAGNOSIS — R928 Other abnormal and inconclusive findings on diagnostic imaging of breast: Secondary | ICD-10-CM

## 2018-06-09 ENCOUNTER — Other Ambulatory Visit: Payer: Self-pay | Admitting: Internal Medicine

## 2018-06-09 DIAGNOSIS — Z0001 Encounter for general adult medical examination with abnormal findings: Secondary | ICD-10-CM

## 2018-06-09 DIAGNOSIS — E785 Hyperlipidemia, unspecified: Secondary | ICD-10-CM

## 2018-06-11 ENCOUNTER — Ambulatory Visit
Admission: RE | Admit: 2018-06-11 | Discharge: 2018-06-11 | Disposition: A | Discharge status: Home / Self Care | Payer: 59 | Source: Ambulatory Visit | Attending: Adult Health | Admitting: Adult Health

## 2018-06-11 ENCOUNTER — Other Ambulatory Visit: Payer: Self-pay | Admitting: Adult Health

## 2018-06-11 DIAGNOSIS — R928 Other abnormal and inconclusive findings on diagnostic imaging of breast: Secondary | ICD-10-CM

## 2018-06-11 DIAGNOSIS — R921 Mammographic calcification found on diagnostic imaging of breast: Secondary | ICD-10-CM | POA: Diagnosis not present

## 2018-06-21 ENCOUNTER — Other Ambulatory Visit: Payer: Self-pay | Admitting: Adult Health

## 2018-06-21 DIAGNOSIS — Z0001 Encounter for general adult medical examination with abnormal findings: Secondary | ICD-10-CM

## 2018-06-21 MED ORDER — CIPROFLOXACIN HCL 500 MG PO TABS: 500.0000 mg | ORAL_TABLET | Freq: Two times a day (BID) | ORAL | 0 refills | Status: DC

## 2018-06-21 MED ORDER — HYOSCYAMINE SULFATE 0.125 MG SL SUBL: 0.1250 mg | SUBLINGUAL_TABLET | SUBLINGUAL | 0 refills | Status: DC | PRN

## 2018-06-21 MED ORDER — ONDANSETRON HCL 4 MG PO TABS: 4.0000 mg | ORAL_TABLET | Freq: Three times a day (TID) | ORAL | 0 refills | Status: DC | PRN

## 2018-06-28 ENCOUNTER — Telehealth: Payer: Self-pay

## 2018-06-28 ENCOUNTER — Other Ambulatory Visit: Payer: Self-pay | Admitting: Adult Health

## 2018-06-28 DIAGNOSIS — R634 Abnormal weight loss: Secondary | ICD-10-CM

## 2018-06-28 NOTE — Progress Notes (Signed)
Patient experiencing unexplained weight loss; pending various cancer screenings for which she was overdue. Per patient preference will also proceed with malabsorption evaluation labs.

## 2018-06-28 NOTE — Telephone Encounter (Signed)
Patient states that she weighed this morning and was 98lbs. She has an upcoming appointment on 07/11/18. Would like to know if she should be seen earlier?

## 2018-06-28 NOTE — Telephone Encounter (Signed)
Patient states that she is going to be seen at OB/GYN on Oct 1st. Has not completed the Cologuard yet but plans on doing so in the near future. Patient has no other signs or symptoms.

## 2018-06-29 ENCOUNTER — Other Ambulatory Visit: Payer: 59

## 2018-06-29 DIAGNOSIS — R634 Abnormal weight loss: Secondary | ICD-10-CM

## 2018-07-05 LAB — CAROTENE, SERUM: CAROTENE, TOTAL-SERUM: 45 ug/dL (ref 6–77)

## 2018-07-05 LAB — CELIAC DISEASE COMPREHENSIVE PANEL WITH REFLEXES
(tTG) Ab, IgA: 1 U/mL
IMMUNOGLOBULIN A: 168 mg/dL (ref 47–310)

## 2018-07-05 LAB — SEDIMENTATION RATE: SED RATE: 2 mm/h (ref 0–30)

## 2018-07-11 ENCOUNTER — Ambulatory Visit: Payer: Self-pay | Admitting: Adult Health

## 2018-07-28 DIAGNOSIS — Z1211 Encounter for screening for malignant neoplasm of colon: Secondary | ICD-10-CM | POA: Diagnosis not present

## 2018-07-31 ENCOUNTER — Ambulatory Visit (INDEPENDENT_AMBULATORY_CARE_PROVIDER_SITE_OTHER): Payer: 59 | Admitting: Women's Health

## 2018-07-31 ENCOUNTER — Encounter: Payer: Self-pay | Admitting: Women's Health

## 2018-07-31 VITALS — BP 110/78 | Ht 63.0 in | Wt 101.0 lb

## 2018-07-31 DIAGNOSIS — N952 Postmenopausal atrophic vaginitis: Secondary | ICD-10-CM

## 2018-07-31 DIAGNOSIS — R87615 Unsatisfactory cytologic smear of cervix: Secondary | ICD-10-CM | POA: Diagnosis not present

## 2018-07-31 DIAGNOSIS — Z01419 Encounter for gynecological examination (general) (routine) without abnormal findings: Secondary | ICD-10-CM | POA: Diagnosis not present

## 2018-07-31 NOTE — Progress Notes (Signed)
la 

## 2018-07-31 NOTE — Progress Notes (Signed)
57 year old WWF G0 presents for Pap smear.  Had annual exam at primary care that included a Pap smear but specimen was unsatisfactory due to scant cells, vaginal atrophy, negative HR HPV.  Has not been sexually active in greater than 10 years.  History of all normal Paps.  Postmenopausal no HRT with no bleeding.  Normal mammogram history last mammogram August 2019.  She denies any problems or concerns today primary care manages hypercholesteremia.  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  Exam: Appears well.  Heart regular rate and rhythm, lungs clear.  Abdomen soft nontender, external genitalia within normal limits, small speculum used with some difficulty, cervix clear without visible lesion or discharge, Pap taken.  Bimanual no CMT or adnexal tenderness.  Pap repeat /vaginal atrophy  Plan: Pap completed.  Will continue care with primary care.  Will return to our office if a repeat Pap is needed, reviewed if Pap normal with the negative high risk HPV can return in 5 years.  Condoms encouraged if becomes sexually active.

## 2018-08-02 LAB — PAP IG W/ RFLX HPV ASCU

## 2018-08-03 LAB — COLOGUARD: Cologuard: NEGATIVE

## 2018-08-06 ENCOUNTER — Encounter: Payer: Self-pay | Admitting: Adult Health

## 2018-08-08 ENCOUNTER — Telehealth: Payer: Self-pay | Admitting: Neurology

## 2018-08-08 NOTE — Telephone Encounter (Signed)
Noted, we have this in the office.

## 2018-08-08 NOTE — Telephone Encounter (Signed)
Pt called to as a reminder to Dr Jaynee Eagles that she will need numbing prior to botox injections on Friday.  FYI

## 2018-08-10 ENCOUNTER — Ambulatory Visit (INDEPENDENT_AMBULATORY_CARE_PROVIDER_SITE_OTHER): Payer: 59 | Admitting: Neurology

## 2018-08-10 DIAGNOSIS — G43711 Chronic migraine without aura, intractable, with status migrainosus: Secondary | ICD-10-CM | POA: Diagnosis not present

## 2018-08-10 MED ORDER — FREMANEZUMAB-VFRM 225 MG/1.5ML ~~LOC~~ SOSY: 225.0000 mg | PREFILLED_SYRINGE | SUBCUTANEOUS | Status: AC

## 2018-08-10 MED ORDER — FREMANEZUMAB-VFRM 225 MG/1.5ML ~~LOC~~ SOSY: 675.0000 mg | PREFILLED_SYRINGE | SUBCUTANEOUS | 4 refills | Status: DC

## 2018-08-10 NOTE — Progress Notes (Signed)
Botox-100unitsx2 vials Lot: [id number] Expiration: 12/2020 NDC: 3664-4034-74 [id number]  0.9% Sodium Chloride- 108mL total Lot: [id number] Expiration: 05/2019 NDC: 88416-606-30  Dx: Z60.109 S/P

## 2018-08-10 NOTE — Patient Instructions (Signed)
Fremanezumab: Patient drug information Access [employer] Online here. Copyright 1978-2019 [employer] All rights reserved. (For additional information see "Fremanezumab: Drug information") Brand Names: [place]  Ajovy  What is this drug used for?   It is used to prevent migraine headaches.  What do I need to tell my doctor BEFORE I take this drug?   If you have an allergy to this drug or any part of this drug.   If you are allergic to any drugs like this one, any other drugs, foods, or other substances. Tell your doctor about the allergy and what signs you had, like rash; hives; itching; shortness of breath; wheezing; cough; swelling of face, lips, tongue, or throat; or any other signs.   This drug may interact with other drugs or health problems.   Tell your doctor and pharmacist about all of your drugs (prescription or OTC, natural products, vitamins) and health problems. You must check to make sure that it is safe for you to take this drug with all of your drugs and health problems. Do not start, stop, or change the dose of any drug without checking with your doctor.  What are some things I need to know or do while I take this drug?   Tell all of your health care providers that you take this drug. This includes your doctors, nurses, pharmacists, and dentists.   Allergic reactions have happened within hours and up to 1 month after this drug was given. Tell your doctor right away about any bad effects after getting this drug.   Tell your doctor if you are pregnant or plan on getting pregnant. You will need to talk about the benefits and risks of using this drug while you are pregnant.   Tell your doctor if you are breast-feeding. You will need to talk about any risks to your baby.  What are some side effects that I need to call my doctor about right away?   WARNING/CAUTION: Even though it may be rare, some people may have very bad and sometimes deadly side effects when taking a drug. Tell your  doctor or get medical help right away if you have any of the following signs or symptoms that may be related to a very bad side effect:   Signs of an allergic reaction, like rash; hives; itching; red, swollen, blistered, or peeling skin with or without fever; wheezing; tightness in the chest or throat; trouble breathing, swallowing, or talking; unusual hoarseness; or swelling of the mouth, face, lips, tongue, or throat.   Very bad irritation where the shot was given.  What are some other side effects of this drug?   All drugs may cause side effects. However, many people have no side effects or only have minor side effects. Call your doctor or get medical help if any of these side effects or any other side effects bother you or do not go away:   Irritation where the shot is given.   These are not all of the side effects that may occur. If you have questions about side effects, call your doctor. Call your doctor for medical advice about side effects.   You may report side effects to your national health agency.  How is this drug best taken?   Use this drug as ordered by your doctor. Read all information given to you. Follow all instructions closely.   It is given as a shot into the fatty part of the skin on the top of the thigh, belly area,   or upper arm.   If you will be giving yourself the shot, your doctor or nurse will teach you how to give the shot.   Follow how to use as you have been told by the doctor or read the package insert.   Wash your hands before use.   If stored in a refrigerator, let this drug come to room temperature before using it. Leave it at room temperature for at least 30 minutes. Do not heat this drug.   Do not shake.   Do not give into skin that is irritated, bruised, red, infected, or scarred.   Do not give into the same place as another shot.   If your dose is more than 1 injection, you may give it in the same body part. Make sure you do not give injections in the same  spot you used for the other injections.   Do not use if the solution is cloudy, leaking, or has particles.   This drug is colorless to a faint yellow. Do not use if the solution changes color.   Each prefilled syringe is for one use only.   Throw away needles in a needle/sharp disposal box. Do not reuse needles or other items. When the box is full, follow all local rules for getting rid of it. Talk with a doctor or pharmacist if you have any questions.  What do I do if I miss a dose?   Take a missed dose as soon as you think about it.   After taking a missed dose, start a new schedule based on when the dose is taken.  How do I store and/or throw out this drug?   Store in a refrigerator. Do not freeze.   Store in the carton to protect from light.   If needed, this drug can be left out at room temperature for up to 24 hours. Throw away drug if left at room temperature for more than 24 hours.   Protect from heat and sunlight.   Keep all drugs in a safe place. Keep all drugs out of the reach of children and pets.   Throw away unused or expired drugs. Do not flush down a toilet or pour down a drain unless you are told to do so. Check with your pharmacist if you have questions about the best way to throw out drugs. There may be drug take-back programs in your area.  General drug facts   If your symptoms or health problems do not get better or if they become worse, call your doctor.   Do not share your drugs with others and do not take anyone else's drugs.   Keep a list of all your drugs (prescription, natural products, vitamins, OTC) with you. Give this list to your doctor.   Talk with the doctor before starting any new drug, including prescription or OTC, natural products, or vitamins.   Some drugs may have another patient information leaflet. If you have any questions about this drug, please talk with your doctor, nurse, pharmacist, or other health care provider.   If you think there has been an  overdose, call your poison control center or get medical care right away. Be ready to tell or show what was taken, how much, and when it happened.   

## 2018-08-10 NOTE — Progress Notes (Signed)
Third botox. Patient has had 50% improvement, she has had 50% less headaches and 50% less migraines. She is much better but still suffering with migraines and headaches a month will layer Ajovy. +masseters, + eyes, NOT on the neck muscles bc had some cramping, temples. Baseline is daily headaches and 15 migraine days a month. The migraines are described as pressure behind her eyes, throbbing pounding, severe light sensitivity, nausea and vomiting. Migraines can last up to 24 hours. She is not on cgrp currently but may add today to see if she can get even better improvement however she has done well on botox. She has 6 headache days a month and they are significantly improved in severity.  Needs numbing cream on the forehead.   +ajovy 3 q71months and will bring them for [place] to inject every 3 months during botox.    Consent Form Botulism Toxin Injection For Chronic Migraine    Reviewed orally with patient, additionally signature is on file:  Botulism toxin has been approved by the Federal drug administration for treatment of chronic migraine. Botulism toxin does not cure chronic migraine and it may not be effective in some patients.  The administration of botulism toxin is accomplished by injecting a small amount of toxin into the muscles of the neck and head. Dosage must be titrated for each individual. Any benefits resulting from botulism toxin tend to wear off after 3 months with a repeat injection required if benefit is to be maintained. Injections are usually done every 3-4 months with maximum effect peak achieved by about 2 or 3 weeks. Botulism toxin is expensive and you should be sure of what costs you will incur resulting from the injection.  The side effects of botulism toxin use for chronic migraine may include:   -Transient, and usually mild, facial weakness with facial injections  -Transient, and usually mild, head or neck weakness with head/neck injections  -Reduction or loss of forehead  facial animation due to forehead muscle weakness  -Eyelid drooping  -Dry eye  -Pain at the site of injection or bruising at the site of injection  -Double vision  -Potential unknown long term risks  Contraindications: You should not have Botox if you are pregnant, nursing, allergic to albumin, have an infection, skin condition, or muscle weakness at the site of the injection, or have myasthenia gravis, Lambert-Eaton syndrome, or ALS.  It is also possible that as with any injection, there may be an allergic reaction or no effect from the medication. Reduced effectiveness after repeated injections is sometimes seen and rarely infection at the injection site may occur. All care will be taken to prevent these side effects. If therapy is given over a long time, atrophy and wasting in the muscle injected may occur. Occasionally the patient's become refractory to treatment because they develop antibodies to the toxin. In this event, therapy needs to be modified.  I have read the above information and consent to the administration of botulism toxin.    BOTOX PROCEDURE NOTE FOR MIGRAINE HEADACHE    Contraindications and precautions discussed with patient(above). Aseptic procedure was observed and patient tolerated procedure. Procedure performed by Dr. Georgia Dom  The condition has existed for more than 6 months, and pt does not have a diagnosis of ALS, Myasthenia Gravis or Lambert-Eaton Syndrome.  Risks and benefits of injections discussed and pt agrees to proceed with the procedure.  Written consent obtained  These injections are medically necessary. Pt  receives good benefits from these injections. These  injections do not cause sedations or hallucinations which the oral therapies may cause.  Indication/Diagnosis: chronic migraine BOTOX(J0585) injection was performed according to protocol by [facility]. 200 units of BOTOX was dissolved into 4 cc NS.   NDC: [address]-40   Description of  procedure:  The patient was placed in a sitting position. The standard protocol was used for Botox as follows, with 5 units of Botox injected at each site:   -Procerus muscle, midline injection  -Corrugator muscle, bilateral injection  -Frontalis muscle, bilateral injection, with 2 sites each side, medial injection was performed in the upper one third of the frontalis muscle, in the region vertical from the medial inferior edge of the superior orbital rim. The lateral injection was again in the upper one third of the forehead vertically above the lateral limbus of the cornea, 1.5 cm lateral to the medial injection site.  -Temporalis muscle injection, 4 sites, bilaterally. The first injection was 3 cm above the tragus of the ear, second injection site was 1.5 cm to 3 cm up from the first injection site in line with the tragus of the ear. The third injection site was 1.5-3 cm forward between the first 2 injection sites. The fourth injection site was 1.5 cm posterior to the second injection site.  -Occipitalis muscle injection, 3 sites, bilaterally. The first injection was done one half way between the occipital protuberance and the tip of the mastoid process behind the ear. The second injection site was done lateral and superior to the first, 1 fingerbreadth from the first injection. The third injection site was 1 fingerbreadth superiorly and medially from the first injection site.  -Cervical paraspinal muscle injection, 2 sites, bilateral knee first injection site was 1 cm from the midline of the cervical spine, 3 cm inferior to the lower border of the occipital protuberance. The second injection site was 1.5 cm superiorly and laterally to the first injection site.  -Trapezius muscle injection was performed at 3 sites, bilaterally. The first injection site was in the upper trapezius muscle halfway between the inflection point of the neck, and the acromion. The second injection site was one half way  between the acromion and the first injection site. The third injection was done between the first injection site and the inflection point of the neck.   Will return for repeat injection in 3 months.   A 200 unit sof Botox was used, 155 units were injected, the rest of the Botox was wasted. The patient tolerated the procedure well, there were no complications of the above procedure.

## 2018-08-14 DIAGNOSIS — D2262 Melanocytic nevi of left upper limb, including shoulder: Secondary | ICD-10-CM | POA: Diagnosis not present

## 2018-08-14 DIAGNOSIS — D692 Other nonthrombocytopenic purpura: Secondary | ICD-10-CM | POA: Diagnosis not present

## 2018-08-14 DIAGNOSIS — D2261 Melanocytic nevi of right upper limb, including shoulder: Secondary | ICD-10-CM | POA: Diagnosis not present

## 2018-08-15 ENCOUNTER — Telehealth: Payer: Self-pay

## 2018-08-15 NOTE — Telephone Encounter (Signed)
PA for ajovy approved from 08/15/18-11/15/18. [facility] informed.

## 2018-08-15 NOTE — Telephone Encounter (Signed)
I completed PA for ajovy via covermymeds, Key: [id number], sent to [facility]. Should have a determination in 1-3 business days.

## 2018-08-28 ENCOUNTER — Other Ambulatory Visit: Payer: Self-pay | Admitting: Neurology

## 2018-08-28 DIAGNOSIS — Z0001 Encounter for general adult medical examination with abnormal findings: Secondary | ICD-10-CM

## 2018-08-28 DIAGNOSIS — G43009 Migraine without aura, not intractable, without status migrainosus: Secondary | ICD-10-CM

## 2018-08-28 DIAGNOSIS — G43711 Chronic migraine without aura, intractable, with status migrainosus: Secondary | ICD-10-CM

## 2018-09-03 ENCOUNTER — Telehealth: Payer: Self-pay | Admitting: Neurology

## 2018-09-03 ENCOUNTER — Encounter: Payer: Self-pay | Admitting: *Deleted

## 2018-09-03 NOTE — Telephone Encounter (Signed)
Pt called stating she is needing rx for name brand rizatriptan (MAXALT-MLT) 10 MG disintegrating tablet [facility]

## 2018-09-03 NOTE — Telephone Encounter (Signed)
[facility] They have the prescription will change it to brand name Maxalt instead. Pharmacist said she would get it ready for the patient.

## 2018-09-03 NOTE — Telephone Encounter (Signed)
Messaged pt through mychart to make her aware.

## 2018-10-30 ENCOUNTER — Other Ambulatory Visit: Payer: Self-pay | Admitting: Neurology

## 2018-11-02 ENCOUNTER — Telehealth: Payer: Self-pay | Admitting: Neurology

## 2018-11-02 DIAGNOSIS — G43009 Migraine without aura, not intractable, without status migrainosus: Secondary | ICD-10-CM

## 2018-11-02 DIAGNOSIS — Z0001 Encounter for general adult medical examination with abnormal findings: Secondary | ICD-10-CM

## 2018-11-02 MED ORDER — RIZATRIPTAN BENZOATE 10 MG PO TBDP: 10.0000 mg | ORAL_TABLET | Freq: Three times a day (TID) | ORAL | 0 refills | Status: DC | PRN

## 2018-11-02 NOTE — Telephone Encounter (Signed)
Ok to switch to brand per v.o. Dr. Jannifer Franklin. Maxalt 10 mg TID PRN.

## 2018-11-02 NOTE — Telephone Encounter (Signed)
Pt is wanting to refill RX rizatriptan (MAXALT-MLT) 10 MG disintegrating tablet at the [facility] on [address]. Pt states she wants the Brand name not Generic.

## 2018-11-05 NOTE — Telephone Encounter (Signed)
I spoke with pt. [facility] sts[facility] getting brand name Maxalt in stock. She wants a new rx. sent to [facility]. [facility] explained [facility] for her. That way there are not multiple rx's floating around at different pharmacies.  She verbalized understanding of same/fim

## 2018-11-05 NOTE — Telephone Encounter (Signed)
Pt is calling again wanting to know what the update is on this medication. Please advise.

## 2018-11-05 NOTE — Telephone Encounter (Signed)
LMOM that a rx. for brand name Maxalt was sent to [facility] [address] on 11/02/18, with receipt confirmed by pharmacy. Please call if she has other questions/fim

## 2018-11-05 NOTE — Telephone Encounter (Signed)
Pt has called in response to RN Faith's message about sending the Rx to the wrong pharmacy.  Pt is asking that the Maxalt be sent to the [facility] on [address]

## 2018-11-12 ENCOUNTER — Telehealth: Payer: Self-pay | Admitting: Neurology

## 2018-11-12 NOTE — Telephone Encounter (Signed)
I called [facility] to schedule delivery. It is pending patient consent. They tried calling the patient but she did not answer so they left a VM. If she calls [place] back please advise her to call [phone] to give consent. DW

## 2018-11-13 NOTE — Telephone Encounter (Signed)
I called and spoke with the patient who stated that she gave consent with the pharmacy.   She wants me to send a reminder to the nurse that she will need a numbing cream put on before her injections. She states that last time the cream didn't have enough time to start working before the was injected and she wants to make sure it is put on earlier this time.

## 2018-11-16 ENCOUNTER — Telehealth: Payer: Self-pay | Admitting: Neurology

## 2018-11-16 ENCOUNTER — Ambulatory Visit: Payer: 59 | Admitting: Neurology

## 2018-11-16 DIAGNOSIS — G43711 Chronic migraine without aura, intractable, with status migrainosus: Secondary | ICD-10-CM

## 2018-11-16 MED ORDER — SUMATRIPTAN 10 MG/ACT NA SOLN: 1.0000 | NASAL | 6 refills | Status: DC | PRN

## 2018-11-16 NOTE — Telephone Encounter (Signed)
Heidi Stone her Botox Follow Up apt for April Set at 4:00 so she want have to miss a lot of work . [phone]

## 2018-11-16 NOTE — Progress Notes (Signed)
Botox- 100 units x 2 vials [id number] Expiration: 02/2021 NDC: 5361-4431-54  Lot: [id number] Expiration: 03/2021 NDC: 9509-3267-12  Bacteriostatic 0.9% Sodium Chloride- 37mL total Lot: [id number] Expiration: 08/01/2019 NDC: 8338-2505-39  Dx: J67.341 S/P

## 2018-11-16 NOTE — Progress Notes (Signed)
Consent Form Botulism Toxin Injection For Chronic Migraine   Interval history 11/16/2018: 4th botox. She had 15 headache free days, of those 5 were migraines. Patient has had > 50% improvement in migraines, she has had 50% less headaches and >50% less migraines. +masseters, + eyes, NOT on the neck muscles bc had some cramping, temples. Baseline is daily headaches and 15 migraine days a month. The migraines are described as pressure behind her eyes, throbbing pounding, severe light sensitivity, nausea and vomiting. Migraines can last up to 24 hours. She is not on cgrp currently but may add today to see if she can get even better improvement however she has done well on botox. She has 6 headache days a month and they are significantly improved in severity.  Needs numbing cream on the forehead.  She takes maxalt for acute management. She has had to take prednisone in the past  The weather really affects her especially the fluctuations.  She is happy with just botox and happy with her current frequency does not want to add another preventative.  Will change acute to Tosymra.    +She did not think Ajovy improved past her botox, ill continue only with botox (she used to bring ajovy 3 q27months to [place] to inject every 3 months during botox, will stop).   Reviewed orally with patient, additionally signature is on file:  Botulism toxin has been approved by the Federal drug administration for treatment of chronic migraine. Botulism toxin does not cure chronic migraine and it may not be effective in some patients.  The administration of botulism toxin is accomplished by injecting a small amount of toxin into the muscles of the neck and head. Dosage must be titrated for each individual. Any benefits resulting from botulism toxin tend to wear off after 3 months with a repeat injection required if benefit is to be maintained. Injections are usually done every 3-4 months with maximum effect peak achieved by about 2 or  3 weeks. Botulism toxin is expensive and you should be sure of what costs you will incur resulting from the injection.  The side effects of botulism toxin use for chronic migraine may include:   -Transient, and usually mild, facial weakness with facial injections  -Transient, and usually mild, head or neck weakness with head/neck injections  -Reduction or loss of forehead facial animation due to forehead muscle weakness  -Eyelid drooping  -Dry eye  -Pain at the site of injection or bruising at the site of injection  -Double vision  -Potential unknown long term risks  Contraindications: You should not have Botox if you are pregnant, nursing, allergic to albumin, have an infection, skin condition, or muscle weakness at the site of the injection, or have myasthenia gravis, Lambert-Eaton syndrome, or ALS.  It is also possible that as with any injection, there may be an allergic reaction or no effect from the medication. Reduced effectiveness after repeated injections is sometimes seen and rarely infection at the injection site may occur. All care will be taken to prevent these side effects. If therapy is given over a long time, atrophy and wasting in the muscle injected may occur. Occasionally the patient's become refractory to treatment because they develop antibodies to the toxin. In this event, therapy needs to be modified.  I have read the above information and consent to the administration of botulism toxin.    BOTOX PROCEDURE NOTE FOR MIGRAINE HEADACHE    Contraindications and precautions discussed with patient(above). Aseptic procedure was observed  and patient tolerated procedure. Procedure performed by Dr. Georgia Dom  The condition has existed for more than 6 months, and pt does not have a diagnosis of ALS, Myasthenia Gravis or Lambert-Eaton Syndrome.  Risks and benefits of injections discussed and pt agrees to proceed with the procedure.  Written consent obtained  These injections  are medically necessary. Pt  receives good benefits from these injections. These injections do not cause sedations or hallucinations which the oral therapies may cause.  Description of procedure:  The patient was placed in a sitting position. The standard protocol was used for Botox as follows, with 5 units of Botox injected at each site:   -Procerus muscle, midline injection  -Corrugator muscle, bilateral injection  -Frontalis muscle, bilateral injection, with 2 sites each side, medial injection was performed in the upper one third of the frontalis muscle, in the region vertical from the medial inferior edge of the superior orbital rim. The lateral injection was again in the upper one third of the forehead vertically above the lateral limbus of the cornea, 1.5 cm lateral to the medial injection site.  - Levator Scapulae: 5 units bilaterally  -Temporalis muscle injection, 5 sites, bilaterally. The first injection was 3 cm above the tragus of the ear, second injection site was 1.5 cm to 3 cm up from the first injection site in line with the tragus of the ear. The third injection site was 1.5-3 cm forward between the first 2 injection sites. The fourth injection site was 1.5 cm posterior to the second injection site. 5th site laterally in the temporalis  muscleat the level of the outer canthus.  - Patient feels her clenching is a trigger for headaches. +5 units masseter bilaterally   - Patient feels the migraines are centered around the eyes +5 units bilaterally at the outer canthus in the orbicularis occuli  -Occipitalis muscle injection, 3 sites, bilaterally. The first injection was done one half way between the occipital protuberance and the tip of the mastoid process behind the ear. The second injection site was done lateral and superior to the first, 1 fingerbreadth from the first injection. The third injection site was 1 fingerbreadth superiorly and medially from the first injection  site.  -Cervical paraspinal muscle injection, 2 sites, bilateral knee first injection site was 1 cm from the midline of the cervical spine, 3 cm inferior to the lower border of the occipital protuberance. The second injection site was 1.5 cm superiorly and laterally to the first injection site.  -Trapezius muscle injection was performed at 3 sites, bilaterally. The first injection site was in the upper trapezius muscle halfway between the inflection point of the neck, and the acromion. The second injection site was one half way between the acromion and the first injection site. The third injection was done between the first injection site and the inflection point of the neck.   Will return for repeat injection in 3 months.   A 200 unit sof Botox was used, any Botox not injected was wasted. The patient tolerated the procedure well, there were no complications of the above procedure.

## 2018-11-19 ENCOUNTER — Telehealth: Payer: Self-pay | Admitting: *Deleted

## 2018-11-19 NOTE — Telephone Encounter (Signed)
Fax received from [facility]. Tosymra 10mg /ACT NA Soln approved for dates 11/19/18-11/20/19. PA# [employer] [id number] HU/fim

## 2018-11-19 NOTE — Telephone Encounter (Signed)
PA for Tosymra 10mg  nasal spray, one spray in nostril every 1 hr. as needed for migraine (max 3 sprays in 24 hours) completed via Cover My Meds, Key# [id number].  Dx: Chronic Migraine (G43.709)/fim

## 2018-11-20 NOTE — Telephone Encounter (Signed)
I called and scheduled the patient for her next injection. DW

## 2019-01-09 ENCOUNTER — Other Ambulatory Visit: Payer: Self-pay | Admitting: Internal Medicine

## 2019-01-09 DIAGNOSIS — Z0001 Encounter for general adult medical examination with abnormal findings: Secondary | ICD-10-CM

## 2019-01-09 DIAGNOSIS — E785 Hyperlipidemia, unspecified: Secondary | ICD-10-CM

## 2019-02-20 ENCOUNTER — Ambulatory Visit: Payer: 59 | Admitting: Neurology

## 2019-04-29 NOTE — Progress Notes (Signed)
Complete Physical  Assessment and Plan:  Encounter for general adult medical examination with abnormal findings  Migraine without aura and without status migrainosus, not intractable Avoid triggers, continue medications Getting botox injections -  Followed by neurology  Arthritis monitor  Allergic state, subsequent encounter Continue OTC allergy pills, eye drops given for breakthrough sx  Hyperlipidemia, unspecified hyperlipidemia type Continue medications: zetia Continue low cholesterol diet and exercise.  Check lipid panel.   Other abnormal glucose Discussed general issues about diabetes pathophysiology and management., Educational material distributed., Suggested low cholesterol diet., Encouraged aerobic exercise., Discussed foot care., Reminded to get yearly retinal exam. - check A1C  Vitamin D deficiency Continue supplementation; discussed goal 70-100 Check vitamin D level -     VITAMIN D 25 Hydroxy (Vit-D Deficiency, Fractures)  BMI <19/fatigue/uninentional weight loss Weight is stable from last year, fatigue intermittent, no other concerning sx Absorption studies were normal last year Recommended cancer screening is UTD Never screened for HIV/hep C- will proceed with this today High protein/calorie diet discussed and information provided  Orders Placed This Encounter  Procedures  . CBC with Differential/Platelet  . COMPLETE METABOLIC PANEL WITH GFR  . Magnesium  . Lipid panel  . TSH  . Hemoglobin A1c  . VITAMIN D 25 Hydroxy (Vit-D Deficiency, Fractures)  . Vitamin B12  . Urinalysis, Routine w reflex microscopic  . HIV Antibody (routine testing w rflx)  . Hepatitis C antibody  . EKG 12-Lead   Discussed med's effects and SE's. Screening labs and tests as requested with regular follow-up as recommended.  Future Appointments  Date Time Provider [place]  05/06/2020 10:00 AM Liane Comber, NP [facility] None     HPI  58 y.o. female  presents  for a complete physical. She has Allergy; Hyperlipidemia; Arthritis; Migraines; History of prediabetes; and Body mass index (BMI) 19.9 or less, adult on their problem list.   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 Widowed, no children.   She has a history of migraines, she has been to Dr. Melton Alar and the headache clinic without help, now seeing Dr. Lavell Anchors for headaches for botox with significant improvement. She reports she still gets headaches, but are much easier to treat/resolve. Having 7 days/month down from 21/month.   She had difficult pelvic in office last year and had repeat pelvic/PAP by GYN [name], NP last year which was unremarkable, recommended 5 year follow up due 2024.   She endorses ongoing fatigue and unintentional weight loss, though weight is stable over the last year, had absorptions studies, celiac, carotene which were normal last year.   BMI is Body mass index is 17.79 kg/m., she has not been working on diet and exercise, endorses has been fatigued. Wt Readings from Last 3 Encounters:  04/30/19 101 lb 3.2 oz (45.9 kg)  07/31/18 101 lb (45.8 kg)  04/26/18 103 lb (46.7 kg)    Her blood pressure has been controlled at home, today their BP is BP: 110/70 She does not workout. She denies chest pain, shortness of breath, dizziness.   She is on cholesterol medication, zetia every day and denies myalgias. Her cholesterol is not at goal. The cholesterol last visit was:   Lab Results  Component Value Date   CHOL 202 (H) 04/26/2018   HDL 69 04/26/2018   LDLCALC 110 (H) 04/26/2018   TRIG 121 04/26/2018   CHOLHDL 2.9 04/26/2018    Last A1C in the office was:  Lab Results  Component Value  Date   HGBA1C 5.6 04/26/2018  .  Patient is on Vitamin D supplement, she is on 2000 IU daily Lab Results  Component Value Date   VD25OH 89 04/24/2017    Lab Results  Component Value Date   VITAMINB12 376 09/03/2015   Lab Results  Component Value Date    IRON 70 09/03/2015   TIBC 458 (H) 09/03/2015   FERRITIN 57 09/04/2014     Current Medications:  Current Outpatient Medications on File Prior to Visit  Medication Sig Dispense Refill  . ezetimibe (ZETIA) 10 MG tablet TAKE 1 TABLET DAILY FOR CHOLESTEROL 90 tablet 1  . fluticasone (FLONASE) 50 MCG/ACT nasal spray PLACE 1 SPRAY INTO BOTH NOSTRILS DAILY. (Patient taking differently: as needed. PLACE 1 SPRAY INTO BOTH NOSTRILS DAILY.) 48 g 3  . OnabotulinumtoxinA (BOTOX IJ) Inject as directed. Injects every three months    . predniSONE (DELTASONE) 20 MG tablet PLEASE SEE ATTACHED FOR DETAILED DIRECTIONS (Patient taking differently: as needed. PLEASE SEE ATTACHED FOR DETAILED DIRECTIONS) 10 tablet 3  . rizatriptan (MAXALT-MLT) 10 MG disintegrating tablet Take 1 tablet (10 mg total) by mouth 3 (three) times daily as needed for migraine. 9 tablet 0   No current facility-administered medications on file prior to visit.    Health Maintenance:   Immunization History  Administered Date(s) Administered  . Influenza Split 08/28/2013, 08/28/2014, 07/29/2015  . Td 07/12/2010  . Tdap 07/29/2015   Tetanus: 2016 Pneumovax: N/A Prevnar 13: due 61 Flu vaccine: 2015 Zostavax: N/A  Pap: 07/2018 at GYN after difficult pelvic in office; negative; due 2024 MGM: 05/2018 DEXA: no family history, on estrogen, Vitamin D due age 74 Colonoscopy: overdue, won't do colonoscopy Cologuard: 07/2018 negative EGD:  Last Dental Exam: Dr. Pollyann Savoy, 12/2018 Last Eye Exam: Dr. [place], 2019, wears glasses [facility], last visit 2019  Patient Care Team: Unk Pinto, MD as PCP - General (Internal Medicine)  Allergies: No Known Allergies Medical History:  Past Medical History:  Diagnosis Date  . Allergy   . Arthritis   . Hyperlipidemia   . Migraines    Surgical History:  Past Surgical History:  Procedure Laterality Date  . NO PAST SURGERIES     Family History:  Patient's  family  history includes Alzheimer's disease in her mother; Heart attack in her paternal uncle; Hyperlipidemia in her father and mother; Hypertension in her father and mother. Social History:  She  reports that she has never smoked. She has never used smokeless tobacco. She reports current alcohol use. She reports that she does not use drugs.  Review of Systems  Constitutional: Negative for chills, fever, malaise/fatigue and weight loss.  HENT: Negative for congestion, ear pain, hearing loss, sore throat and tinnitus.   Eyes: Negative.  Negative for blurred vision and double vision.  Respiratory: Negative for cough, sputum production, shortness of breath and wheezing.   Cardiovascular: Negative for chest pain, palpitations, orthopnea, claudication, leg swelling and PND.  Gastrointestinal: Negative for abdominal pain, blood in stool, constipation, diarrhea, heartburn, melena, nausea and vomiting.  Genitourinary: Negative.   Musculoskeletal: Negative for falls, joint pain and myalgias.  Skin: Negative for rash.  Neurological: Positive for headaches. Negative for dizziness, tingling, sensory change, loss of consciousness and weakness.  Endo/Heme/Allergies: Negative for polydipsia.  Psychiatric/Behavioral: Negative.  Negative for depression, memory loss, substance abuse and suicidal ideas. The patient is not nervous/anxious and does not have insomnia.   All other systems reviewed and are negative.   Physical Exam: Estimated body  mass index is 17.79 kg/m as calculated from the following:   Height as of this encounter: 5' 3.25" (1.607 m).   Weight as of this encounter: 101 lb 3.2 oz (45.9 kg). BP 110/70   Pulse 85   Temp 97.7 F (36.5 C)   Ht 5' 3.25" (1.607 m)   Wt 101 lb 3.2 oz (45.9 kg)   SpO2 98%   BMI 17.79 kg/m  General Appearance: Thin adult, in no apparent distress.  Eyes: PERRLA, EOMs, conjunctiva no swelling or erythema Sinuses: No Frontal/maxillary tenderness  ENT/Mouth: Ext aud  canals clear, normal light reflex with TMs without erythema, bulging. Good dentition. No erythema, swelling, or exudate on post pharynx. Tonsils not swollen or erythematous. Hearing normal. Neck: Supple, thyroid normal. No bruits  Respiratory: Respiratory effort normal, BS equal bilaterally without rales, rhonchi, wheezing or stridor.  Cardio: RRR without murmurs, rubs or gallops. Brisk peripheral pulses without edema.  Chest: symmetric, with normal excursions and percussion.  Breasts: Symmetric, without nipple discharge, retractions. Very fibrous/irregular throughout limiting exam  Abdomen: Firm throughout/patient unable to relax, nontender, no guarding, rebound, palpable hernias, masses, or organomegaly. .  Lymphatics: Non tender without lymphadenopathy.  Genitourinary: Deferred to GYN, no concerns Musculoskeletal: Full ROM all peripheral extremities, 5/5 strength, and normal gait.  Skin: Warm, dry without rashes, lesions, ecchymosis. Neuro: Cranial nerves intact, reflexes equal bilaterally. Normal muscle tone, no cerebellar symptoms. Sensation intact.  Psych: Awake and oriented X 3, normal affect, Insight and Judgment appropriate.   EKG:  WNL, NSCPT, no ST changes   Gorden Harms Heidi Stone 10:38 AM [facility]

## 2019-04-30 ENCOUNTER — Other Ambulatory Visit: Payer: Self-pay

## 2019-04-30 ENCOUNTER — Ambulatory Visit (INDEPENDENT_AMBULATORY_CARE_PROVIDER_SITE_OTHER): Payer: 59 | Admitting: Adult Health

## 2019-04-30 ENCOUNTER — Encounter: Payer: Self-pay | Admitting: Adult Health

## 2019-04-30 VITALS — BP 110/70 | HR 85 | Temp 97.7°F | Ht 63.25 in | Wt 101.2 lb

## 2019-04-30 DIAGNOSIS — R5383 Other fatigue: Secondary | ICD-10-CM

## 2019-04-30 DIAGNOSIS — Z0001 Encounter for general adult medical examination with abnormal findings: Secondary | ICD-10-CM

## 2019-04-30 DIAGNOSIS — Z136 Encounter for screening for cardiovascular disorders: Secondary | ICD-10-CM

## 2019-04-30 DIAGNOSIS — Z8249 Family history of ischemic heart disease and other diseases of the circulatory system: Secondary | ICD-10-CM | POA: Diagnosis not present

## 2019-04-30 DIAGNOSIS — E785 Hyperlipidemia, unspecified: Secondary | ICD-10-CM

## 2019-04-30 DIAGNOSIS — Z131 Encounter for screening for diabetes mellitus: Secondary | ICD-10-CM

## 2019-04-30 DIAGNOSIS — Z79899 Other long term (current) drug therapy: Secondary | ICD-10-CM

## 2019-04-30 DIAGNOSIS — D649 Anemia, unspecified: Secondary | ICD-10-CM

## 2019-04-30 DIAGNOSIS — E559 Vitamin D deficiency, unspecified: Secondary | ICD-10-CM

## 2019-04-30 DIAGNOSIS — H1013 Acute atopic conjunctivitis, bilateral: Secondary | ICD-10-CM

## 2019-04-30 DIAGNOSIS — G43009 Migraine without aura, not intractable, without status migrainosus: Secondary | ICD-10-CM

## 2019-04-30 DIAGNOSIS — Z681 Body mass index (BMI) 19 or less, adult: Secondary | ICD-10-CM

## 2019-04-30 DIAGNOSIS — Z0000 Encounter for general adult medical examination without abnormal findings: Secondary | ICD-10-CM

## 2019-04-30 DIAGNOSIS — Z114 Encounter for screening for human immunodeficiency virus [HIV]: Secondary | ICD-10-CM

## 2019-04-30 DIAGNOSIS — I10 Essential (primary) hypertension: Secondary | ICD-10-CM

## 2019-04-30 DIAGNOSIS — Z833 Family history of diabetes mellitus: Secondary | ICD-10-CM

## 2019-04-30 DIAGNOSIS — Z1159 Encounter for screening for other viral diseases: Secondary | ICD-10-CM

## 2019-04-30 DIAGNOSIS — R634 Abnormal weight loss: Secondary | ICD-10-CM

## 2019-04-30 DIAGNOSIS — M1990 Unspecified osteoarthritis, unspecified site: Secondary | ICD-10-CM

## 2019-04-30 DIAGNOSIS — T7840XD Allergy, unspecified, subsequent encounter: Secondary | ICD-10-CM

## 2019-04-30 MED ORDER — BEPOTASTINE BESILATE 1.5 % OP SOLN: 1.0000 [drp] | Freq: Two times a day (BID) | OPHTHALMIC | 1 refills | Status: DC | PRN

## 2019-04-30 MED ORDER — NABUMETONE 500 MG PO TABS: 500.0000 mg | ORAL_TABLET | Freq: Two times a day (BID) | ORAL | 2 refills | Status: DC | PRN

## 2019-04-30 NOTE — Patient Instructions (Addendum)
Heidi Stone , Thank you for taking time to come for your Annual Wellness Visit. I appreciate your ongoing commitment to your health goals. Please review the following plan we discussed and let me know if I can assist you in the future.   This is a list of the screening recommended for you and due dates:  Health Maintenance  Topic Date Due  . Colon Cancer Screening  11/04/2008  . HIV Screening  10/05/2021*  .  Hepatitis C: One time screening is recommended by Center for Disease Control  (CDC) for  adults born from [birth year] through 1967.   10/03/2024*  . Flu Shot  06/01/2019  . Mammogram  06/05/2020  . Pap Smear  07/31/2021  . Tetanus Vaccine  07/28/2025  *Topic was postponed. The date shown is not the original due date.     Know what a healthy weight is for you (roughly BMI <25) and aim to maintain this  Aim for 7+ servings of fruits and vegetables daily  65-80+ fluid ounces of water or unsweet tea for healthy kidneys  Limit to max 1 drink of alcohol per day; avoid smoking/tobacco  Limit animal fats in diet for cholesterol and heart health - choose grass fed whenever available  Avoid highly processed foods, and foods high in saturated/trans fats  Aim for low stress - take time to unwind and care for your mental health  Aim for 150 min of moderate intensity exercise weekly for heart health, and weights twice weekly for bone health  Aim for 7-9 hours of sleep daily   High-Protein and High-Calorie Diet Eating high-protein and high-calorie foods can help you to gain weight, heal after an injury, and recover after an illness or surgery. The specific amount of daily protein and calories you need depends on:  Your body weight.  The reason this diet is recommended for you. What is my plan? Generally, a high-protein, high-calorie diet involves:  Eating 250-500 extra calories each day.  Making sure that you get enough of your daily calories from protein. Ask your health care  provider how many of your calories should come from protein. Talk with a health care provider, such as a diet and nutrition specialist (dietitian), about how much protein and how many calories you need each day. Follow the diet as directed by your health care provider. What are tips for following this plan?  Preparing meals  Add whole milk, half-and-half, or heavy cream to cereal, pudding, soup, or hot cocoa.  Add whole milk to instant breakfast drinks.  Add peanut butter to oatmeal or smoothies.  Add powdered milk to baked goods, smoothies, or milkshakes.  Add powdered milk, cream, or butter to mashed potatoes.  Add cheese to cooked vegetables.  Make whole-milk yogurt parfaits. Top them with granola, fruit, or nuts.  Add cottage cheese to your fruit.  Add avocado, cheese, or both to sandwiches or salads.  Add meat, poultry, or seafood to rice, pasta, casseroles, salads, and soups.  Use mayonnaise when making egg salad, chicken salad, or tuna salad.  Use peanut butter as a dip for vegetables or as a topping for pretzels, celery, or crackers.  Add beans to casseroles, dips, and spreads.  Add pureed beans to sauces and soups.  Replace calorie-free drinks with calorie-containing drinks, such as milk and fruit juice.  Replace water with milk or heavy cream when making foods such as oatmeal, pudding, or cocoa. General instructions  Ask your health care provider if you should take a  nutritional supplement.  Try to eat six small meals each day instead of three large meals.  Eat a balanced diet. In each meal, include one food that is high in protein.  Keep nutritious snacks available, such as nuts, trail mixes, dried fruit, and yogurt.  If you have kidney disease or diabetes, talk with your health care provider about how much protein is safe for you. Too much protein may put extra stress on your kidneys.  Drink your calories. Choose high-calorie drinks and have them after  your meals. What high-protein foods should I eat?  Vegetables Soybeans. Peas. Grains Quinoa. Bulgur wheat. Meats and other proteins Beef, pork, and poultry. Fish and seafood. Eggs. Tofu. Textured vegetable protein (TVP). Peanut butter. Nuts and seeds. Dried beans. Protein powders. Dairy Whole milk. Whole-milk yogurt. Powdered milk. Cheese. [employer]. Eggnog. Beverages High-protein supplement drinks. Soy milk. Other foods Protein bars. The items listed above may not be a complete list of high-protein foods and beverages. Contact a dietitian for more options. What high-calorie foods should I eat? Fruits Dried fruit. Fruit leather. Canned fruit in syrup. Fruit juice. Avocado. Vegetables Vegetables cooked in oil or butter. Fried potatoes. Grains Pasta. Quick breads. Muffins. Pancakes. Ready-to-eat cereal. Meats and other proteins Peanut butter. Nuts and seeds. Dairy Heavy cream. Whipped cream. Cream cheese. Sour cream. Ice cream. Custard. Pudding. Beverages Meal-replacement beverages. Nutrition shakes. Fruit juice. Sugar-sweetened soft drinks. Seasonings and condiments Salad dressing. Mayonnaise. Alfredo sauce. Fruit preserves or jelly. Honey. Syrup. Sweets and desserts Cake. Cookies. Pie. Pastries. Candy bars. Chocolate. Fats and oils Butter or margarine. Oil. Gravy. Other foods Meal-replacement bars. The items listed above may not be a complete list of high-calorie foods and beverages. Contact a dietitian for more options. Summary  A high-protein, high-calorie diet can help you gain weight or heal faster after an injury, illness, or surgery.  To increase your protein and calories, add ingredients such as whole milk, peanut butter, cheese, beans, meat, or seafood to meal items.  To get enough extra calories each day, include high-calorie foods and beverages at each meal.  Adding a high-calorie drink or shake can be an easy way to help you get enough calories each day.  Talk with your healthcare provider or dietitian about the best options for you. This information is not intended to replace advice given to you by your health care provider. Make sure you discuss any questions you have with your health care provider. Document Released: 10/17/2005 Document Revised: 09/29/2017 Document Reviewed: 08/29/2017 Elsevier Patient Education  2020 [employer].

## 2019-05-01 LAB — COMPLETE METABOLIC PANEL WITH GFR
AG Ratio: 2 (calc) (ref 1.0–2.5)
ALT: 18 U/L (ref 6–29)
AST: 20 U/L (ref 10–35)
Albumin: 4.5 g/dL (ref 3.6–5.1)
Alkaline phosphatase (APISO): 118 U/L (ref 37–153)
BUN: 12 mg/dL (ref 7–25)
CO2: 29 mmol/L (ref 20–32)
Calcium: 9.7 mg/dL (ref 8.6–10.4)
Chloride: 105 mmol/L (ref 98–110)
Creat: 0.81 mg/dL (ref 0.50–0.99)
GFR, Est African American: 91 mL/min/{1.73_m2} (ref >=60)
GFR, Est Non African American: 79 mL/min/{1.73_m2} (ref >=60)
Globulin: 2.2 g/dL (calc) (ref 1.9–3.7)
Glucose, Bld: 92 mg/dL (ref 65–99)
Potassium: 4.3 mmol/L (ref 3.5–5.3)
Sodium: 141 mmol/L (ref 135–146)
Total Bilirubin: 0.6 mg/dL (ref 0.2–1.2)
Total Protein: 6.7 g/dL (ref 6.1–8.1)

## 2019-05-01 LAB — LIPID PANEL
Cholesterol: 188 mg/dL (ref <200)
HDL: 65 mg/dL (ref >=50)
LDL Cholesterol (Calc): 102 mg/dL (calc) — ABNORMAL HIGH
Non-HDL Cholesterol (Calc): 123 mg/dL (calc) (ref <130)
Total CHOL/HDL Ratio: 2.9 (calc) (ref <5.0)
Triglycerides: 118 mg/dL (ref <150)

## 2019-05-01 LAB — URINALYSIS, ROUTINE W REFLEX MICROSCOPIC
Bilirubin Urine: NEGATIVE
Glucose, UA: NEGATIVE
Hgb urine dipstick: NEGATIVE
Ketones, ur: NEGATIVE
Leukocytes,Ua: NEGATIVE
Nitrite: NEGATIVE
Protein, ur: NEGATIVE
Specific Gravity, Urine: 1.011 (ref 1.001–1.03)
pH: 8 (ref 5.0–8.0)

## 2019-05-01 LAB — CBC WITH DIFFERENTIAL/PLATELET
Absolute Monocytes: 391 cells/uL (ref 200–950)
Basophils Absolute: 61 cells/uL (ref 0–200)
Basophils Relative: 1.1 %
Eosinophils Absolute: 99 cells/uL (ref 15–500)
Eosinophils Relative: 1.8 %
HCT: 39.4 % (ref 35.0–45.0)
Hemoglobin: 13.1 g/dL (ref 11.7–15.5)
Lymphs Abs: 1870 cells/uL (ref 850–3900)
MCH: 29.9 pg (ref 27.0–33.0)
MCHC: 33.2 g/dL (ref 32.0–36.0)
MCV: 90 fL (ref 80.0–100.0)
MPV: 11.4 fL (ref 7.5–12.5)
Monocytes Relative: 7.1 %
Neutro Abs: 3080 cells/uL (ref 1500–7800)
Neutrophils Relative %: 56 %
Platelets: 247 10*3/uL (ref 140–400)
RBC: 4.38 10*6/uL (ref 3.80–5.10)
RDW: 12.8 % (ref 11.0–15.0)
Total Lymphocyte: 34 %
WBC: 5.5 10*3/uL (ref 3.8–10.8)

## 2019-05-01 LAB — HEMOGLOBIN A1C
Hgb A1c MFr Bld: 5.4 % of total Hgb (ref <5.7)
Mean Plasma Glucose: 108 (calc)
eAG (mmol/L): 6 (calc)

## 2019-05-01 LAB — HIV ANTIBODY (ROUTINE TESTING W REFLEX): HIV 1&2 Ab, 4th Generation: NONREACTIVE

## 2019-05-01 LAB — VITAMIN D 25 HYDROXY (VIT D DEFICIENCY, FRACTURES): Vit D, 25-Hydroxy: 80 ng/mL (ref 30–100)

## 2019-05-01 LAB — VITAMIN B12: Vitamin B-12: 375 pg/mL (ref 200–1100)

## 2019-05-01 LAB — HEPATITIS C ANTIBODY
Hepatitis C Ab: NONREACTIVE
SIGNAL TO CUT-OFF: 0 (ref <1.00)

## 2019-05-01 LAB — MAGNESIUM: Magnesium: 2.2 mg/dL (ref 1.5–2.5)

## 2019-05-01 LAB — TSH: TSH: 1.44 mIU/L (ref 0.40–4.50)

## 2019-05-02 ENCOUNTER — Other Ambulatory Visit: Payer: Self-pay | Admitting: Adult Health

## 2019-05-02 DIAGNOSIS — E559 Vitamin D deficiency, unspecified: Secondary | ICD-10-CM | POA: Insufficient documentation

## 2019-05-02 MED ORDER — NABUMETONE 500 MG PO TABS: 500.0000 mg | ORAL_TABLET | Freq: Two times a day (BID) | ORAL | 0 refills | Status: DC | PRN

## 2019-06-18 ENCOUNTER — Ambulatory Visit (INDEPENDENT_AMBULATORY_CARE_PROVIDER_SITE_OTHER): Payer: 59 | Admitting: Neurology

## 2019-06-18 ENCOUNTER — Telehealth: Payer: Self-pay | Admitting: Neurology

## 2019-06-18 ENCOUNTER — Other Ambulatory Visit: Payer: Self-pay

## 2019-06-18 VITALS — Temp 98.4°F

## 2019-06-18 DIAGNOSIS — G43711 Chronic migraine without aura, intractable, with status migrainosus: Secondary | ICD-10-CM

## 2019-06-18 NOTE — Telephone Encounter (Signed)
4 mo Botox inj // [facility]

## 2019-06-18 NOTE — Telephone Encounter (Signed)
I called and scheduled the patient. DW  °

## 2019-06-18 NOTE — Progress Notes (Signed)
Consent Form Botulism Toxin Injection For Chronic Migraine   Interval history 06/18/2019: Last botox was 11/16/2018 was delayed due to Covid. 5th botox. She had 15 headache free days, of those 5 were migraines. Patient has had > 50% improvement in migraines, she has had 50% less headaches and >50% less migraines. +masseters, + eyes, NOT on the neck muscles bc had some cramping, temples. Baseline is daily headaches and 15 migraine days a month. The migraines are described as pressure behind her eyes, throbbing pounding, severe light sensitivity, nausea and vomiting. Migraines can last up to 24 hours. She is not on cgrp currently but may add today to see if she can get even better improvement however she has done well on botox. She has 6 headache days a month and they are significantly improved in severity.  Needs numbing cream on the forehead.  She takes maxalt for acute management. She has had to take prednisone in the past  The weather really affects her especially the fluctuations.  She is happy with just botox and happy with her current frequency does not want to add another preventative.  Will change acute to Tosymra.   Repeat in 4 months, botox lasted quite a bit longer, 4 month appointment.  +masseters, +10 each orb ocui, medial corrugators, not temples. Not cervical paraspinals   +She did not think Ajovy improved past her botox, ill continue only with botox (she used to bring ajovy 3 q60months to [place] to inject every 3 months during botox, will stop).   Reviewed orally with patient, additionally signature is on file:  Botulism toxin has been approved by the Federal drug administration for treatment of chronic migraine. Botulism toxin does not cure chronic migraine and it may not be effective in some patients.  The administration of botulism toxin is accomplished by injecting a small amount of toxin into the muscles of the neck and head. Dosage must be titrated for each individual. Any benefits  resulting from botulism toxin tend to wear off after 3 months with a repeat injection required if benefit is to be maintained. Injections are usually done every 3-4 months with maximum effect peak achieved by about 2 or 3 weeks. Botulism toxin is expensive and you should be sure of what costs you will incur resulting from the injection.  The side effects of botulism toxin use for chronic migraine may include:   -Transient, and usually mild, facial weakness with facial injections  -Transient, and usually mild, head or neck weakness with head/neck injections  -Reduction or loss of forehead facial animation due to forehead muscle weakness  -Eyelid drooping  -Dry eye  -Pain at the site of injection or bruising at the site of injection  -Double vision  -Potential unknown long term risks  Contraindications: You should not have Botox if you are pregnant, nursing, allergic to albumin, have an infection, skin condition, or muscle weakness at the site of the injection, or have myasthenia gravis, Lambert-Eaton syndrome, or ALS.  It is also possible that as with any injection, there may be an allergic reaction or no effect from the medication. Reduced effectiveness after repeated injections is sometimes seen and rarely infection at the injection site may occur. All care will be taken to prevent these side effects. If therapy is given over a long time, atrophy and wasting in the muscle injected may occur. Occasionally the patient's become refractory to treatment because they develop antibodies to the toxin. In this event, therapy needs to be modified.  I have read the above information and consent to the administration of botulism toxin.    BOTOX PROCEDURE NOTE FOR MIGRAINE HEADACHE    Contraindications and precautions discussed with patient(above). Aseptic procedure was observed and patient tolerated procedure. Procedure performed by Dr. Georgia Dom  The condition has existed for more than 6 months,  and pt does not have a diagnosis of ALS, Myasthenia Gravis or Lambert-Eaton Syndrome.  Risks and benefits of injections discussed and pt agrees to proceed with the procedure.  Written consent obtained  These injections are medically necessary. Pt  receives good benefits from these injections. These injections do not cause sedations or hallucinations which the oral therapies may cause.  Description of procedure:  The patient was placed in a sitting position. The standard protocol was used for Botox as follows, with 5 units of Botox injected at each site:   -Procerus muscle, midline injection  -Corrugator muscle, bilateral injection  - Lateral corrugator, bilateral injection  -Frontalis muscle, bilateral injection, with 2 sites each side, medial injection was performed in the upper one third of the frontalis muscle, in the region vertical from the medial inferior edge of the superior orbital rim. The lateral injection was again in the upper one third of the forehead vertically above the lateral limbus of the cornea, 1.5 cm lateral to the medial injection site.  -Temporalis muscle injection, 4sites, bilaterally. The first injection was 3 cm above the tragus of the ear, second injection site was 1.5 cm to 3 cm up from the first injection site in line with the tragus of the ear. The third injection site was 1.5-3 cm forward between the first 2 injection sites. The fourth injection site was 1.5 cm posterior to the second injection site.   - Patient feels her clenching is a trigger for headaches. +5 units masseter bilaterally   - Patient feels the migraines are centered around the eyes +10 units bilaterally at the outer canthus in the orbicularis occuli  -Occipitalis muscle injection, 3 sites, bilaterally. The first injection was done one half way between the occipital protuberance and the tip of the mastoid process behind the ear. The second injection site was done lateral and superior to the first,  1 fingerbreadth from the first injection. The third injection site was 1 fingerbreadth superiorly and medially from the first injection site.  -Cervical paraspinal muscle injection, 2 sites, bilateral knee first injection site was 1 cm from the midline of the cervical spine, 3 cm inferior to the lower border of the occipital protuberance. The second injection site was 1.5 cm superiorly and laterally to the first injection site.  Will return for repeat injection in 3 months.   A 200 unit sof Botox was used, any Botox not injected was wasted. The patient tolerated the procedure well, there were no complications of the above procedure.

## 2019-06-18 NOTE — Progress Notes (Signed)
Botox- 100 units x 2 vials Lot: [id number] Expiration: 12/2021 NDC: 0023-1145-01  Bacteriostatic 0.9% Sodium Chloride- 4mL total Lot: [id number] Expiration: 08/01/2019 NDC: 0409-1966-02  Dx: G43.711 S/P  

## 2019-07-29 ENCOUNTER — Other Ambulatory Visit: Payer: Self-pay | Admitting: Internal Medicine

## 2019-07-29 DIAGNOSIS — Z0001 Encounter for general adult medical examination with abnormal findings: Secondary | ICD-10-CM

## 2019-07-29 DIAGNOSIS — E785 Hyperlipidemia, unspecified: Secondary | ICD-10-CM

## 2019-08-13 ENCOUNTER — Ambulatory Visit: Payer: 59

## 2019-08-21 ENCOUNTER — Ambulatory Visit: Payer: 59

## 2019-09-16 ENCOUNTER — Telehealth: Payer: Self-pay

## 2019-09-16 NOTE — Telephone Encounter (Signed)
Patient returned missed call. Please follow up. °

## 2019-09-16 NOTE — Progress Notes (Signed)
Consent Form Botulism Toxin Injection For Chronic Migraine   Interval history 09/17/2019: . She had 15 headache free days, of those 5 were migraines. Patient has had > 50% improvement in migraines, she has had 50% less headaches and >50% less migraines. none, NOT on the neck muscles bc had some cramping. Baseline is daily headaches and 15 migraine days a month. The migraines are described as pressure behind her eyes, throbbing pounding, severe light sensitivity, nausea and vomiting. Migraines can last up to 24 hours. She is not on cgrp currently.  Needs numbing cream on the forehead.  She takes maxalt for acute management. She has had to take prednisone in the past  The weather really affects her especially the fluctuations.  She is happy with just botox and happy with her current frequency does not want to add another preventative.  Will change acute to Tosymra. +She did not think Ajovy improved past her botox, ill continue only with botox (she used to bring ajovy 3 q9months to [place] to inject every 3 months during botox, will stop).   Consent Form Botulism Toxin Injection For Chronic Migraine    Reviewed orally with patient, additionally signature is on file:  Botulism toxin has been approved by the Federal drug administration for treatment of chronic migraine. Botulism toxin does not cure chronic migraine and it may not be effective in some patients.  The administration of botulism toxin is accomplished by injecting a small amount of toxin into the muscles of the neck and head. Dosage must be titrated for each individual. Any benefits resulting from botulism toxin tend to wear off after 3 months with a repeat injection required if benefit is to be maintained. Injections are usually done every 3-4 months with maximum effect peak achieved by about 2 or 3 weeks. Botulism toxin is expensive and you should be sure of what costs you will incur resulting from the injection.  The side effects of  botulism toxin use for chronic migraine may include:   -Transient, and usually mild, facial weakness with facial injections  -Transient, and usually mild, head or neck weakness with head/neck injections  -Reduction or loss of forehead facial animation due to forehead muscle weakness  -Eyelid drooping  -Dry eye  -Pain at the site of injection or bruising at the site of injection  -Double vision  -Potential unknown long term risks  Contraindications: You should not have Botox if you are pregnant, nursing, allergic to albumin, have an infection, skin condition, or muscle weakness at the site of the injection, or have myasthenia gravis, Lambert-Eaton syndrome, or ALS.  It is also possible that as with any injection, there may be an allergic reaction or no effect from the medication. Reduced effectiveness after repeated injections is sometimes seen and rarely infection at the injection site may occur. All care will be taken to prevent these side effects. If therapy is given over a long time, atrophy and wasting in the muscle injected may occur. Occasionally the patient's become refractory to treatment because they develop antibodies to the toxin. In this event, therapy needs to be modified.  I have read the above information and consent to the administration of botulism toxin.    BOTOX PROCEDURE NOTE FOR MIGRAINE HEADACHE    Contraindications and precautions discussed with patient(above). Aseptic procedure was observed and patient tolerated procedure. Procedure performed by Dr. Georgia Dom  The condition has existed for more than 6 months, and pt does not have a diagnosis of ALS, Myasthenia  Gravis or Lambert-Eaton Syndrome.  Risks and benefits of injections discussed and pt agrees to proceed with the procedure.  Written consent obtained  These injections are medically necessary. Pt  receives good benefits from these injections. These injections do not cause sedations or hallucinations which the  oral therapies may cause.  Description of procedure:  The patient was placed in a sitting position. The standard protocol was used for Botox as follows, with 5 units of Botox injected at each site:   -Procerus muscle, midline injection  -Corrugator muscle, bilateral injection  -Frontalis muscle, bilateral injection, with 2 sites each side, medial injection was performed in the upper one third of the frontalis muscle, in the region vertical from the medial inferior edge of the superior orbital rim. The lateral injection was again in the upper one third of the forehead vertically above the lateral limbus of the cornea, 1.5 cm lateral to the medial injection site.  -Temporalis muscle injection, 4 sites, bilaterally. The first injection was 3 cm above the tragus of the ear, second injection site was 1.5 cm to 3 cm up from the first injection site in line with the tragus of the ear. The third injection site was 1.5-3 cm forward between the first 2 injection sites. The fourth injection site was 1.5 cm posterior to the second injection site.   -Occipitalis muscle injection, 3 sites, bilaterally. The first injection was done one half way between the occipital protuberance and the tip of the mastoid process behind the ear. The second injection site was done lateral and superior to the first, 1 fingerbreadth from the first injection. The third injection site was 1 fingerbreadth superiorly and medially from the first injection site.  -Cervical paraspinal muscle injection, 2 sites, bilateral knee first injection site was 1 cm from the midline of the cervical spine, 3 cm inferior to the lower border of the occipital protuberance. The second injection site was 1.5 cm superiorly and laterally to the first injection site.  -Trapezius muscle injection was performed at 3 sites, bilaterally. The first injection site was in the upper trapezius muscle halfway between the inflection point of the neck, and the acromion.  The second injection site was one half way between the acromion and the first injection site. The third injection was done between the first injection site and the inflection point of the neck.   Will return for repeat injection in 3 months.   200 units of Botox was used, any Botox not injected was wasted. The patient tolerated the procedure well, there were no complications of the above procedure.

## 2019-09-16 NOTE — Telephone Encounter (Signed)
I have called the patient X3 regarding giving consent to the pharmacy for her Botox medication. The pharmacy has also called her.

## 2019-09-17 ENCOUNTER — Other Ambulatory Visit: Payer: Self-pay

## 2019-09-17 ENCOUNTER — Ambulatory Visit: Payer: 59 | Admitting: Neurology

## 2019-09-17 VITALS — Temp 97.5°F

## 2019-09-17 DIAGNOSIS — G43711 Chronic migraine without aura, intractable, with status migrainosus: Secondary | ICD-10-CM

## 2019-09-17 NOTE — Telephone Encounter (Signed)
Spoke to patient, she gave consent and medication is on the way. DW

## 2019-09-17 NOTE — Progress Notes (Signed)
Botox- 200 units x 1 vial Lot: [id number] Expiration: 05/2022 NDC: [id number]  Bacteriostatic 0.9% Sodium Chloride- 53mL total Lot: [id number] Expiration: 04/30/2020 NDC: [id number]  Dx: [id number] S/P

## 2019-10-08 ENCOUNTER — Ambulatory Visit: Payer: 59 | Admitting: Neurology

## 2019-11-18 ENCOUNTER — Other Ambulatory Visit: Payer: Self-pay | Admitting: Neurology

## 2019-12-20 ENCOUNTER — Telehealth: Payer: Self-pay | Admitting: Neurology

## 2019-12-20 NOTE — Telephone Encounter (Signed)
Patient called to cancel her botox apt and has already contacted pharmacy to cancel order,

## 2019-12-23 ENCOUNTER — Ambulatory Visit: Payer: 59 | Attending: Family

## 2019-12-23 DIAGNOSIS — Z23 Encounter for immunization: Secondary | ICD-10-CM

## 2019-12-23 NOTE — Progress Notes (Signed)
   Covid-19 Vaccination Clinic  Name:  GEORGINIA MEDEARIS    MRN: [id number] DOB: 05-14-1961  12/23/2019  Ms. Furio was observed post Covid-19 immunization for 15 minutes without incidence. She was provided with Vaccine Information Sheet and instruction to access the V-Safe system.   Ms. Oen was instructed to call 911 with any severe reactions post vaccine: Marland Kitchen Difficulty breathing  . Swelling of your face and throat  . A fast heartbeat  . A bad rash all over your body  . Dizziness and weakness    Immunizations Administered    Name Date Dose VIS Date Route   Moderna COVID-19 Vaccine 12/23/2019 11:33 AM 0.5 mL 10/01/2019 Intramuscular   Manufacturer: Moderna   Lot: [id number]   [id number]

## 2019-12-23 NOTE — Telephone Encounter (Signed)
Noted, thank you. DWD

## 2019-12-24 ENCOUNTER — Ambulatory Visit: Payer: 59 | Admitting: Neurology

## 2020-01-20 ENCOUNTER — Other Ambulatory Visit: Payer: Self-pay | Admitting: Neurology

## 2020-01-21 ENCOUNTER — Ambulatory Visit: Payer: 59 | Attending: Family

## 2020-01-21 DIAGNOSIS — Z23 Encounter for immunization: Secondary | ICD-10-CM

## 2020-01-21 NOTE — Progress Notes (Signed)
   Covid-19 Vaccination Clinic  Name:  Heidi Stone    MRN: [id number] DOB: April 11, 1961  01/21/2020  [name] was observed post Covid-19 immunization for 15 minutes without incident. She was provided with Vaccine Information Sheet and instruction to access the V-Safe system.   [name] was instructed to call 911 with any severe reactions post vaccine: Marland Kitchen Difficulty breathing  . Swelling of face and throat  . A fast heartbeat  . A bad rash all over body  . Dizziness and weakness   Immunizations Administered    Name Date Dose VIS Date Route   Moderna COVID-19 Vaccine 01/21/2020  2:28 PM 0.5 mL 10/01/2019 Intramuscular   Manufacturer: Moderna   [id number]   [place]VO:[id number]

## 2020-02-08 ENCOUNTER — Other Ambulatory Visit: Payer: Self-pay | Admitting: Internal Medicine

## 2020-02-08 DIAGNOSIS — Z0001 Encounter for general adult medical examination with abnormal findings: Secondary | ICD-10-CM

## 2020-02-08 DIAGNOSIS — E785 Hyperlipidemia, unspecified: Secondary | ICD-10-CM

## 2020-03-03 ENCOUNTER — Other Ambulatory Visit: Payer: Self-pay | Admitting: Internal Medicine

## 2020-03-03 DIAGNOSIS — Z1231 Encounter for screening mammogram for malignant neoplasm of breast: Secondary | ICD-10-CM

## 2020-05-02 ENCOUNTER — Other Ambulatory Visit: Payer: Self-pay | Admitting: Internal Medicine

## 2020-05-02 DIAGNOSIS — E785 Hyperlipidemia, unspecified: Secondary | ICD-10-CM

## 2020-05-02 DIAGNOSIS — Z0001 Encounter for general adult medical examination with abnormal findings: Secondary | ICD-10-CM

## 2020-05-05 NOTE — Progress Notes (Signed)
Complete Physical  Assessment and Plan:  Encounter for general adult medical examination with abnormal findings Patient to schedule mammogram later this year  Migraine without aura and without status migrainosus, not intractable Avoid triggers, continue medications Getting botox injections - patient preference, wanting to limit Discussed ajovy, nurtec; nurtec samples given, if work well may try ajovy or similar Followed by neurology  Arthritis Monitor, mild, declines imaging or referral at this time   Allergic state, subsequent encounter Continue OTC allergy pills  Hyperlipidemia, unspecified hyperlipidemia type Continue medications: zetia Continue low cholesterol diet and exercise.  Check lipid panel.   Other abnormal glucose Discussed general issues about diabetes pathophysiology and management., Educational material distributed., Suggested low cholesterol diet., Encouraged aerobic exercise. - check A1C  Vitamin D deficiency Continue supplementation; discussed goal 70-100 Check vitamin D level -     VITAMIN D 25 Hydroxy (Vit-D Deficiency, Fractures)  BMI <19 Stable, has been at this weight her whole life eating regular meals Continue to recommend diet heavy in fruits and veggies and low in animal meats, cheeses, and dairy products, appropriate calorie intake Discuss exercise recommendations routinely Continue to monitor weight at each visit   Orders Placed This Encounter  Procedures  . CBC with Differential/Platelet  . COMPLETE METABOLIC PANEL WITH GFR  . Magnesium  . Lipid panel  . TSH  . Hemoglobin A1c  . VITAMIN D 25 Hydroxy (Vit-D Deficiency, Fractures)  . Vitamin B12  . Urinalysis, Routine w reflex microscopic  . Iron, TIBC and Ferritin Panel  . EKG 12-Lead   Discussed med's effects and SE's. Screening labs and tests as requested with regular follow-up as recommended.  Future Appointments  Date Time Provider [place]  05/07/2021 10:00 AM  Liane Comber, NP [facility] None     HPI  59 y.o. female  presents for a complete physical. She has Allergy; Hyperlipidemia; Arthritis; Migraines; History of prediabetes; BMI less than 19,adult; and Vitamin D deficiency on their problem list.   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  Widowed, no children.   She has a history of migraines, now seeing Dr. Lavell Anchors for headaches for botox with significant improvement, She reports she still gets headaches, but are much easier to treat/resolve. Has cut back during the pandemic, felt some muscle weakness and wanted to limit, hasn't had since Jan 2021. Only having mild headaches recently. Having 4 days/month down from 21/month. Takes maxalt PRN.    She had difficult pelvic in office and had repeat pelvic/PAP by GYN [name], NP in 2019 which was unremarkable, recommended 5 year follow up due 2024.   BMI is Body mass index is 18.25 kg/m., she reports hasn't been exercising, plans to restart walking Eating more during the pandemic Admits to eating a lot of processed food, not enough fruits/veggies She reports drinks flavored water and green tea 1 bottle of diet mountain dew in the morning Does whey protein shake  Wt Readings from Last 3 Encounters:  05/06/20 103 lb (46.7 kg)  04/30/19 101 lb 3.2 oz (45.9 kg)  07/31/18 101 lb (45.8 kg)    Her blood pressure has been controlled at home, today their BP is BP: 118/72 She does not workout. She denies chest pain, shortness of breath, dizziness.   She is on cholesterol medication, zetia every day and denies myalgias. Her cholesterol is not at goal. The cholesterol last visit was:   Lab Results  Component Value Date   CHOL 188 04/30/2019  HDL 65 04/30/2019   LDLCALC 102 (H) 04/30/2019   TRIG 118 04/30/2019   CHOLHDL 2.9 04/30/2019    Last A1C in the office was:  Lab Results  Component Value Date   HGBA1C 5.4 04/30/2019  .   Patient is on Vitamin D  supplement, she is on 2000 IU daily Lab Results  Component Value Date   VD25OH 67 04/30/2019    She reports is taking a new supplement since last year, tablet, unsure about dose Lab Results  Component Value Date   VITAMINB12 375 04/30/2019     Current Medications:  Current Outpatient Medications on File Prior to Visit  Medication Sig Dispense Refill  . Bepotastine Besilate 1.5 % SOLN Place 1 drop into both eyes 2 (two) times daily as needed. 10 mL 1  . BOTOX 100 units SOLR injection Provider to inject 155 units into the muscles of the head and neck every 3 months. Discard remainder. 2 each 0  . ezetimibe (ZETIA) 10 MG tablet Take 1 tablet Daily for Cholesterol 90 tablet 0  . fluticasone (FLONASE) 50 MCG/ACT nasal spray PLACE 1 SPRAY INTO BOTH NOSTRILS DAILY. (Patient taking differently: as needed. PLACE 1 SPRAY INTO BOTH NOSTRILS DAILY.) 48 g 3  . MAXALT-MLT 10 MG disintegrating tablet DISSOLVE 1 TABLET UNDER TONGUE AS NEEDED FOR MIGRAINE 9 tablet 2  . nabumetone (RELAFEN) 500 MG tablet Take 1 tablet (500 mg total) by mouth 2 (two) times daily as needed. 180 tablet 0  . predniSONE (DELTASONE) 20 MG tablet PLEASE SEE ATTACHED FOR DETAILED DIRECTIONS (Patient not taking: Reported on 05/06/2020) 10 tablet 3   No current facility-administered medications on file prior to visit.   Health Maintenance:   Immunization History  Administered Date(s) Administered  . Influenza Split 08/28/2013, 08/28/2014, 07/29/2015  . Influenza-Unspecified 07/31/2018  . Moderna SARS-COVID-2 Vaccination 12/23/2019, 01/21/2020  . Td 07/12/2010  . Tdap 07/29/2015   Tetanus: 2016 Pneumovax: N/A Prevnar 13: due 63 Flu vaccine: 2015 Zostavax: - will check with insurance  Covid 19: 2/2, 2021, moderna  Pap: 07/2018 at GYN after difficult pelvic in office; negative; due 2024 MGM: 05/2018 - patient plans to schedule in Oct DEXA: no family history, on estrogen, Vitamin D due age 61 Colonoscopy: overdue, won't do  colonoscopy Cologuard: 07/2018 negative EGD:  Last Dental Exam: Dr. Pollyann Savoy, 2021, goes q50m Last Eye Exam: Dr. [place], 2019, wears glasses [place], last visit 2019, will schedule later this ummer  Patient Care Team: Unk Pinto, MD as PCP - General (Internal Medicine) Melvenia Beam, MD as Consulting Physician (Neurology)  Allergies: No Known Allergies Medical History:  Past Medical History:  Diagnosis Date  . Allergy   . Arthritis   . Hyperlipidemia   . Migraines    Surgical History:  Past Surgical History:  Procedure Laterality Date  . LAPAROSCOPIC ABDOMINAL EXPLORATION  1990   Family History:  Patient's  family history includes Alzheimer's disease in her mother; Diabetes in her maternal uncle, paternal aunt, and paternal grandmother; Heart attack in her paternal uncle; Hyperlipidemia in her father and mother; Hypertension in her father and mother. Social History:  She  reports that she has never smoked. She has never used smokeless tobacco. She reports current alcohol use. She reports that she does not use drugs.  Review of Systems  Constitutional: Positive for malaise/fatigue. Negative for chills, fever and weight loss.  HENT: Negative for congestion, ear pain, hearing loss, sore throat and tinnitus.   Eyes: Negative.  Negative  for blurred vision and double vision.  Respiratory: Negative for cough, sputum production, shortness of breath and wheezing.   Cardiovascular: Negative for chest pain, palpitations, orthopnea, claudication, leg swelling and PND.  Gastrointestinal: Negative for abdominal pain, blood in stool, constipation, diarrhea, heartburn, melena, nausea and vomiting.  Genitourinary: Negative.   Musculoskeletal: Positive for joint pain (R knee lateral with stair climbing ). Negative for falls and myalgias.  Skin: Negative for rash.  Neurological: Positive for headaches. Negative for dizziness, tingling, sensory change, loss of  consciousness and weakness.  Endo/Heme/Allergies: Negative for polydipsia.  Psychiatric/Behavioral: Negative.  Negative for depression, memory loss, substance abuse and suicidal ideas. The patient is not nervous/anxious and does not have insomnia.   All other systems reviewed and are negative.   Physical Exam: Estimated body mass index is 18.25 kg/m as calculated from the following:   Height as of this encounter: 5\' 3"  (1.6 m).   Weight as of this encounter: 103 lb (46.7 kg). BP 118/72   Pulse 71   Temp (!) 96.9 F (36.1 C)   Ht 5\' 3"  (1.6 m)   Wt 103 lb (46.7 kg)   SpO2 99%   BMI 18.25 kg/m  General Appearance: Thin adult, in no apparent distress.  Eyes: PERRLA, EOMs, conjunctiva no swelling or erythema Sinuses: No Frontal/maxillary tenderness  ENT/Mouth: Ext aud canals clear, normal light reflex with TMs without erythema, bulging. Good dentition. No erythema, swelling, or exudate on post pharynx. Tonsils not swollen or erythematous. Hearing normal. Neck: Supple, thyroid normal. No bruits  Respiratory: Respiratory effort normal, BS equal bilaterally without rales, rhonchi, wheezing or stridor.  Cardio: RRR without murmurs, rubs or gallops. Brisk peripheral pulses without edema.  Chest: symmetric, with normal excursions and percussion.  Breasts: Declines manual exam today  Abdomen: Firm throughout/patient unable to relax, nontender, no guarding, rebound, palpable hernias, masses, or organomegaly. .  Lymphatics: Non tender without lymphadenopathy.  Genitourinary: Deferred to GYN, no concerns Musculoskeletal: Full ROM all peripheral extremities, 5/5 strength, and normal gait. No laxity, effusion of knee.  Skin: Warm, dry without rashes, lesions, ecchymosis. Neuro: Cranial nerves intact, reflexes equal bilaterally. Normal muscle tone, no cerebellar symptoms. Sensation intact.  Psych: Awake and oriented X 3, normal affect, Insight and Judgment appropriate.   EKG:  WNL, NSCPT, no ST  changes   Gorden Harms Adin Lariccia 10:41 AM [facility]

## 2020-05-06 ENCOUNTER — Encounter: Payer: Self-pay | Admitting: Adult Health

## 2020-05-06 ENCOUNTER — Other Ambulatory Visit: Payer: Self-pay

## 2020-05-06 ENCOUNTER — Ambulatory Visit (INDEPENDENT_AMBULATORY_CARE_PROVIDER_SITE_OTHER): Payer: 59 | Admitting: Adult Health

## 2020-05-06 VITALS — BP 118/72 | HR 71 | Temp 96.9°F | Ht 63.0 in | Wt 103.0 lb

## 2020-05-06 DIAGNOSIS — Z1329 Encounter for screening for other suspected endocrine disorder: Secondary | ICD-10-CM

## 2020-05-06 DIAGNOSIS — Z0000 Encounter for general adult medical examination without abnormal findings: Secondary | ICD-10-CM | POA: Diagnosis not present

## 2020-05-06 DIAGNOSIS — Z136 Encounter for screening for cardiovascular disorders: Secondary | ICD-10-CM | POA: Diagnosis not present

## 2020-05-06 DIAGNOSIS — I10 Essential (primary) hypertension: Secondary | ICD-10-CM

## 2020-05-06 DIAGNOSIS — G43009 Migraine without aura, not intractable, without status migrainosus: Secondary | ICD-10-CM

## 2020-05-06 DIAGNOSIS — Z79899 Other long term (current) drug therapy: Secondary | ICD-10-CM

## 2020-05-06 DIAGNOSIS — Z87898 Personal history of other specified conditions: Secondary | ICD-10-CM

## 2020-05-06 DIAGNOSIS — E559 Vitamin D deficiency, unspecified: Secondary | ICD-10-CM

## 2020-05-06 DIAGNOSIS — T7840XD Allergy, unspecified, subsequent encounter: Secondary | ICD-10-CM

## 2020-05-06 DIAGNOSIS — Z681 Body mass index (BMI) 19 or less, adult: Secondary | ICD-10-CM

## 2020-05-06 DIAGNOSIS — R5383 Other fatigue: Secondary | ICD-10-CM

## 2020-05-06 DIAGNOSIS — D649 Anemia, unspecified: Secondary | ICD-10-CM

## 2020-05-06 DIAGNOSIS — E785 Hyperlipidemia, unspecified: Secondary | ICD-10-CM

## 2020-05-06 DIAGNOSIS — Z131 Encounter for screening for diabetes mellitus: Secondary | ICD-10-CM

## 2020-05-06 DIAGNOSIS — Z0001 Encounter for general adult medical examination with abnormal findings: Secondary | ICD-10-CM

## 2020-05-06 DIAGNOSIS — M1990 Unspecified osteoarthritis, unspecified site: Secondary | ICD-10-CM

## 2020-05-06 DIAGNOSIS — E538 Deficiency of other specified B group vitamins: Secondary | ICD-10-CM

## 2020-05-06 NOTE — Patient Instructions (Addendum)
  Ms. Ground , Thank you for taking time to come for your Annual Wellness Visit. I appreciate your ongoing commitment to your health goals. Please review the following plan we discussed and let me know if I can assist you in the future.   These are the goals we discussed: Goals    . DIET - EAT MORE FRUITS AND VEGETABLES       This is a list of the screening recommended for you and due dates:  Health Maintenance  Topic Date Due  . Colon Cancer Screening  Never done  . Flu Shot  05/31/2020  . Mammogram  06/05/2020  . Pap Smear  07/31/2021  . Tetanus Vaccine  07/28/2025  . COVID-19 Vaccine  Completed  .  Hepatitis C: One time screening is recommended by Center for Disease Control  (CDC) for  adults born from [birth year] through 1967.   Completed  . HIV Screening  Completed    Please ask insurance about shingrix - if they cover, can get [facility]   Try nurtec as needed for migraine headaches - if they work well, may discuss ajovy or similar injectable med for migraines with Dr. Jaynee Eagles  Know what a healthy weight is for you (roughly BMI <25) and aim to maintain this  Aim for 7+ servings of fruits and vegetables daily  65-80+ fluid ounces of water or unsweet tea for healthy kidneys  Limit to max 1 drink of alcohol per day; avoid smoking/tobacco  Limit animal fats in diet for cholesterol and heart health - choose grass fed whenever available  Avoid highly processed foods, and foods high in saturated/trans fats  Aim for low stress - take time to unwind and care for your mental health  Aim for 150 min of moderate intensity exercise weekly for heart health, and weights twice weekly for bone health  Aim for 7-9 hours of sleep daily   A great goal to work towards is aiming to get in a serving daily of some of the most nutritionally dense foods - G- BOMBS daily

## 2020-05-07 LAB — COMPLETE METABOLIC PANEL WITH GFR
AG Ratio: 2.2 (calc) (ref 1.0–2.5)
ALT: 12 U/L (ref 6–29)
AST: 19 U/L (ref 10–35)
Albumin: 4.6 g/dL (ref 3.6–5.1)
Alkaline phosphatase (APISO): 96 U/L (ref 37–153)
BUN: 15 mg/dL (ref 7–25)
CO2: 30 mmol/L (ref 20–32)
Calcium: 9.6 mg/dL (ref 8.6–10.4)
Chloride: 105 mmol/L (ref 98–110)
Creat: 0.73 mg/dL (ref 0.50–0.99)
GFR, Est African American: 103 mL/min/{1.73_m2} (ref >=60)
GFR, Est Non African American: 89 mL/min/{1.73_m2} (ref >=60)
Globulin: 2.1 g/dL (calc) (ref 1.9–3.7)
Glucose, Bld: 93 mg/dL (ref 65–99)
Potassium: 3.8 mmol/L (ref 3.5–5.3)
Sodium: 143 mmol/L (ref 135–146)
Total Bilirubin: 0.5 mg/dL (ref 0.2–1.2)
Total Protein: 6.7 g/dL (ref 6.1–8.1)

## 2020-05-07 LAB — CBC WITH DIFFERENTIAL/PLATELET
Absolute Monocytes: 383 cells/uL (ref 200–950)
Basophils Absolute: 59 cells/uL (ref 0–200)
Basophils Relative: 1.1 %
Eosinophils Absolute: 103 cells/uL (ref 15–500)
Eosinophils Relative: 1.9 %
HCT: 39.7 % (ref 35.0–45.0)
Hemoglobin: 13.3 g/dL (ref 11.7–15.5)
Lymphs Abs: 1987 cells/uL (ref 850–3900)
MCH: 30.4 pg (ref 27.0–33.0)
MCHC: 33.5 g/dL (ref 32.0–36.0)
MCV: 90.6 fL (ref 80.0–100.0)
MPV: 11 fL (ref 7.5–12.5)
Monocytes Relative: 7.1 %
Neutro Abs: 2867 cells/uL (ref 1500–7800)
Neutrophils Relative %: 53.1 %
Platelets: 225 10*3/uL (ref 140–400)
RBC: 4.38 10*6/uL (ref 3.80–5.10)
RDW: 12.6 % (ref 11.0–15.0)
Total Lymphocyte: 36.8 %
WBC: 5.4 10*3/uL (ref 3.8–10.8)

## 2020-05-07 LAB — LIPID PANEL
Cholesterol: 183 mg/dL (ref <200)
HDL: 67 mg/dL (ref >=50)
LDL Cholesterol (Calc): 99 mg/dL (calc)
Non-HDL Cholesterol (Calc): 116 mg/dL (calc) (ref <130)
Total CHOL/HDL Ratio: 2.7 (calc) (ref <5.0)
Triglycerides: 83 mg/dL (ref <150)

## 2020-05-07 LAB — IRON,TIBC AND FERRITIN PANEL
%SAT: 24 % (calc) (ref 16–45)
Ferritin: 84 ng/mL (ref 16–288)
Iron: 81 ug/dL (ref 45–160)
TIBC: 337 mcg/dL (calc) (ref 250–450)

## 2020-05-07 LAB — URINALYSIS, ROUTINE W REFLEX MICROSCOPIC
Bilirubin Urine: NEGATIVE
Glucose, UA: NEGATIVE
Hgb urine dipstick: NEGATIVE
Ketones, ur: NEGATIVE
Leukocytes,Ua: NEGATIVE
Nitrite: NEGATIVE
Protein, ur: NEGATIVE
Specific Gravity, Urine: 1.011 (ref 1.001–1.03)
pH: 6 (ref 5.0–8.0)

## 2020-05-07 LAB — TSH: TSH: 1.77 mIU/L (ref 0.40–4.50)

## 2020-05-07 LAB — VITAMIN B12: Vitamin B-12: 1581 pg/mL — ABNORMAL HIGH (ref 200–1100)

## 2020-05-07 LAB — MAGNESIUM: Magnesium: 2.2 mg/dL (ref 1.5–2.5)

## 2020-05-07 LAB — HEMOGLOBIN A1C
Hgb A1c MFr Bld: 5.5 % of total Hgb (ref <5.7)
Mean Plasma Glucose: 111 (calc)
eAG (mmol/L): 6.2 (calc)

## 2020-05-07 LAB — VITAMIN D 25 HYDROXY (VIT D DEFICIENCY, FRACTURES): Vit D, 25-Hydroxy: 72 ng/mL (ref 30–100)

## 2020-06-23 ENCOUNTER — Other Ambulatory Visit: Payer: Self-pay | Admitting: Adult Health

## 2020-06-23 DIAGNOSIS — Z1231 Encounter for screening mammogram for malignant neoplasm of breast: Secondary | ICD-10-CM

## 2020-06-25 ENCOUNTER — Encounter: Payer: Self-pay | Admitting: Adult Health

## 2020-06-25 ENCOUNTER — Ambulatory Visit: Payer: 59 | Admitting: Adult Health

## 2020-06-25 ENCOUNTER — Other Ambulatory Visit: Payer: Self-pay

## 2020-06-25 ENCOUNTER — Ambulatory Visit
Admission: RE | Admit: 2020-06-25 | Discharge: 2020-06-25 | Disposition: A | Discharge status: Home / Self Care | Payer: 59 | Source: Ambulatory Visit | Attending: Adult Health | Admitting: Adult Health

## 2020-06-25 VITALS — BP 124/72 | HR 97 | Temp 97.3°F | Wt 101.0 lb

## 2020-06-25 DIAGNOSIS — M25532 Pain in left wrist: Secondary | ICD-10-CM

## 2020-06-25 DIAGNOSIS — R2232 Localized swelling, mass and lump, left upper limb: Secondary | ICD-10-CM

## 2020-06-25 NOTE — Progress Notes (Signed)
Assessment and Plan:  Heidi Stone was seen today for ganglion cyst.  Diagnoses and all orders for this visit:  Lump of left wrist Suggestive of simple ganglion cyst; mild pain and tingling only with full extension of wrist Consider [place] pending xray to evaluate underlying bony etiology due to pain preceding lump; consider referral to hand specialist Discussed typically benign and commonly will resolve spontaneously  With benign sx, may be reasonable to monitor  However follow up immediately if worsening wrist pain, swelling, numbness of fingers  Left wrist pain Will obtain xray due to reports of 3-4 month hx of wrist pain -     DG Wrist Complete Left; Future  Further disposition pending results of labs. Discussed med's effects and SE's.   Over 30 minutes of exam, counseling, chart review, and critical decision making was performed.   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   She reports 3-4 months of mild left wrist pain. Didn't think much of it, aching, non-radiating. No injury or swelling in joint. No weakness or tingling. Worst after working.  Then 3 days ago she noted a new lump to dorsum of wrist, visible with wrist flexion. She reports pain with extension, mild tingling in second and 3rd digits only with extension.   Past Medical History:  Diagnosis Date  . Allergy   . Arthritis   . Hyperlipidemia   . Migraines      No Known Allergies  Current Outpatient Medications  on File Prior to Visit  Medication Sig  . Bepotastine Besilate 1.5 % SOLN Place 1 drop into both eyes 2 (two) times daily as needed.  . ezetimibe (ZETIA) 10 MG tablet Take 1 tablet Daily for Cholesterol  . fluticasone (FLONASE) 50 MCG/ACT nasal spray PLACE 1 SPRAY INTO BOTH NOSTRILS DAILY. (Patient taking differently: as needed. PLACE 1 SPRAY INTO BOTH NOSTRILS DAILY.)  . MAXALT-MLT 10 MG disintegrating tablet DISSOLVE 1 TABLET UNDER TONGUE AS NEEDED FOR MIGRAINE  . nabumetone (RELAFEN) 500 MG tablet Take 1 tablet (500 mg total) by mouth 2 (two) times daily as needed.  Marland Kitchen BOTOX 100 units SOLR injection Provider to inject 155 units into the muscles of the head and neck every 3 months. Discard remainder.  . predniSONE (DELTASONE) 20 MG tablet PLEASE SEE ATTACHED FOR DETAILED DIRECTIONS (Patient not taking: Reported on 05/06/2020)   No current facility-administered medications on file prior to visit.    ROS: all negative except above.   Physical Exam:  BP 124/72   Pulse 97   Temp (!) 97.3 F (36.3 C)   Wt 101 lb (45.8 kg)   SpO2 96%   BMI 17.89 kg/m   General Appearance: Well nourished, in no apparent distress. Eyes: PERRLA, conjunctiva no swelling or erythema ENT/Mouth: Mask in place Hearing normal.  Neck: Supple Respiratory: Respiratory effort normal Cardio: Appears well perfused, Brisk peripheral pulses without edema.  Lymphatics: Non tender without lymphadenopathy.  Musculoskeletal:  Full ROM bil wrists, strength symmetrical and intact, smooth, rubbery ~1  cm lump to dorsum of left wrist, visible with extension but not in neurtral position, mobile, non-tender, non-erythematous. Transilluminates.  Skin: Warm, dry without rashes, lesions, ecchymosis.  Neuro: Normal muscle tone, Sensation intact.  Psych: Awake and oriented X 3, normal affect, Insight and Judgment appropriate.     Izora Ribas, NP 3:24 PM [facility]

## 2020-06-25 NOTE — Patient Instructions (Signed)
Ganglion Cyst  A ganglion cyst is a non-cancerous, fluid-filled lump that occurs near a joint or tendon. The cyst grows out of a joint or the lining of a tendon. Ganglion cysts most often develop in the hand or wrist, but they can also develop in the shoulder, elbow, hip, knee, ankle, or foot. Ganglion cysts are ball-shaped or egg-shaped. Their size can range from the size of a pea to larger than a grape. Increased activity may cause the cyst to get bigger because more fluid starts to build up. What are the causes? The exact cause of this condition is not known, but it may be related to:  Inflammation or irritation around the joint.  An injury.  Repetitive movements or overuse.  Arthritis. What increases the risk? You are more likely to develop this condition if:  You are a woman.  You are 40-59 years old. What are the signs or symptoms? The main symptom of this condition is a lump. It most often appears on the hand or wrist. In many cases, there are no other symptoms, but a cyst can sometimes cause:  Tingling.  Pain.  Numbness.  Muscle weakness.  Weak grip.  Less range of motion in a joint. How is this diagnosed? Ganglion cysts are usually diagnosed based on a physical exam. Your health care provider will feel the lump and may shine a light next to it. If it is a ganglion cyst, the light will likely shine through it. Your health care provider may order an X-ray, ultrasound, or MRI to rule out other conditions. How is this treated? Ganglion cysts often go away on their own without treatment. If you have pain or other symptoms, treatment may be needed. Treatment is also needed if the ganglion cyst limits your movement or if it gets infected. Treatment may include:  Wearing a brace or splint on your wrist or finger.  Taking anti-inflammatory medicine.  Having fluid drained from the lump with a needle (aspiration).  Getting a steroid injected into the joint.  Having  surgery to remove the ganglion cyst.  Placing a pad on your shoe or wearing shoes that will not rub against the cyst if it is on your foot. Follow these instructions at home:  Do not press on the ganglion cyst, poke it with a needle, or hit it.  Take over-the-counter and prescription medicines only as told by your health care provider.  If you have a brace or splint: ? Wear it as told by your health care provider. ? Remove it as told by your health care provider. Ask if you need to remove it when you take a shower or a bath.  Watch your ganglion cyst for any changes.  Keep all follow-up visits as told by your health care provider. This is important. Contact a health care provider if:  Your ganglion cyst becomes larger or more painful.  You have pus coming from the lump.  You have weakness or numbness in the affected area.  You have a fever or chills. Get help right away if:  You have a fever and have any of these in the cyst area: ? Increased redness. ? Red streaks. ? Swelling. Summary  A ganglion cyst is a non-cancerous, fluid-filled lump that occurs near a joint or tendon.  Ganglion cysts most often develop in the hand or wrist, but they can also develop in the shoulder, elbow, hip, knee, ankle, or foot.  Ganglion cysts often go away on their own without treatment.  This information is not intended to replace advice given to you by your health care provider. Make sure you discuss any questions you have with your health care provider. Document Revised: 09/29/2017 Document Reviewed: 06/16/2017 Elsevier Patient Education  2020 [employer]vier Inc[employer]  

## 2020-06-26 ENCOUNTER — Other Ambulatory Visit: Payer: Self-pay | Admitting: Adult Health

## 2020-06-26 MED ORDER — MELOXICAM 15 MG PO TABS
ORAL_TABLET | ORAL | 0 refills | Status: DC
Start: 2020-06-26 — End: 2020-09-06

## 2020-06-29 ENCOUNTER — Other Ambulatory Visit: Payer: Self-pay | Admitting: Adult Health

## 2020-06-29 MED ORDER — NURTEC 75 MG PO TBDP: ORAL_TABLET | ORAL | 2 refills | Status: DC

## 2020-06-30 ENCOUNTER — Telehealth: Payer: Self-pay | Admitting: Neurology

## 2020-06-30 NOTE — Telephone Encounter (Signed)
She can restart botox without an appointment as long as insurance approves it this way. Yes, she can go to NP. thanks

## 2020-06-30 NOTE — Telephone Encounter (Signed)
Patient called in wanting to schedule a Botox appointment. She has not had an injection or an office visit since November of last year. Should she come in for an office visit first, or is it okay to resume injections? If okay to resume injections, please advise if she is appropriate to place with Amy. NP has cancellation next week that I placed on hold for patient. Patient has [employer] which does not require PA, but will still call [employer] to make sure. Will need time to get her medication from [place].

## 2020-07-02 NOTE — Telephone Encounter (Signed)
I called [employer] to schedule delivery of Botox. I spoke with Marylyn Ishihara who states that patient has a new insurance through [employer]. He gave me the number to call. [phone]).

## 2020-07-07 NOTE — Telephone Encounter (Signed)
The below information is incorrect, I called [employer] today and spoke with Lexine Baton who was able to complete medical benefit review. She called patient and gained consent for shipment. Botox TBD 9/8.

## 2020-07-08 ENCOUNTER — Ambulatory Visit (INDEPENDENT_AMBULATORY_CARE_PROVIDER_SITE_OTHER): Payer: 59 | Admitting: Family Medicine

## 2020-07-08 ENCOUNTER — Other Ambulatory Visit: Payer: Self-pay

## 2020-07-08 DIAGNOSIS — G43009 Migraine without aura, not intractable, without status migrainosus: Secondary | ICD-10-CM

## 2020-07-08 NOTE — Progress Notes (Signed)
Botox- 100 units x 2 vials Lot: [id number] Expiration: 11/23 NDC: 4356-8616-83  Bacteriostatic 0.9% Sodium Chloride- 32mL total Lot: [id number] Expiration: 02/23 NDC: 1552-0802-23  Dx: V61.224 S/P

## 2020-07-08 NOTE — Progress Notes (Signed)
Last Botox was in 09/2019. She reports migraine frequency and intensity has worsened over the past few months. She has about 10 migrainous days per month. She usually takes rizatriptan that helps at times. PCP gave her a sample of Nurtec that helped but next time she took it, she didn't feel it worked as well.   She requests Biofreeze for pain relief with frontal injections. We will not inject procerus or corrugators per her request.   Consent Form Botulism Toxin Injection For Chronic Migraine    Reviewed orally with patient, additionally signature is on file:  Botulism toxin has been approved by the Federal drug administration for treatment of chronic migraine. Botulism toxin does not cure chronic migraine and it may not be effective in some patients.  The administration of botulism toxin is accomplished by injecting a small amount of toxin into the muscles of the neck and head. Dosage must be titrated for each individual. Any benefits resulting from botulism toxin tend to wear off after 3 months with a repeat injection required if benefit is to be maintained. Injections are usually done every 3-4 months with maximum effect peak achieved by about 2 or 3 weeks. Botulism toxin is expensive and you should be sure of what costs you will incur resulting from the injection.  The side effects of botulism toxin use for chronic migraine may include:   -Transient, and usually mild, facial weakness with facial injections  -Transient, and usually mild, head or neck weakness with head/neck injections  -Reduction or loss of forehead facial animation due to forehead muscle weakness  -Eyelid drooping  -Dry eye  -Pain at the site of injection or bruising at the site of injection  -Double vision  -Potential unknown long term risks   Contraindications: You should not have Botox if you are pregnant, nursing, allergic to albumin, have an infection, skin condition, or muscle weakness at the site of the  injection, or have myasthenia gravis, Lambert-Eaton syndrome, or ALS.  It is also possible that as with any injection, there may be an allergic reaction or no effect from the medication. Reduced effectiveness after repeated injections is sometimes seen and rarely infection at the injection site may occur. All care will be taken to prevent these side effects. If therapy is given over a long time, atrophy and wasting in the muscle injected may occur. Occasionally the patient's become refractory to treatment because they develop antibodies to the toxin. In this event, therapy needs to be modified.  I have read the above information and consent to the administration of botulism toxin.    BOTOX PROCEDURE NOTE FOR MIGRAINE HEADACHE  Contraindications and precautions discussed with patient(above). Aseptic procedure was observed and patient tolerated procedure. Procedure performed by Debbora Presto, FNP-C.   The condition has existed for more than 6 months, and pt does not have a diagnosis of ALS, Myasthenia Gravis or Lambert-Eaton Syndrome.  Risks and benefits of injections discussed and pt agrees to proceed with the procedure.  Written consent obtained  These injections are medically necessary. Pt  receives good benefits from these injections. These injections do not cause sedations or hallucinations which the oral therapies may cause.   Description of procedure:  The patient was placed in a sitting position. The standard protocol was used for Botox as follows, with 5 units of Botox injected at each site:  -Frontalis muscle, bilateral injection, with 2 sites each side, medial injection was performed in the upper one third of the frontalis muscle,  in the region vertical from the medial inferior edge of the superior orbital rim. The lateral injection was again in the upper one third of the forehead vertically above the lateral limbus of the cornea, 1.5 cm lateral to the medial injection site.  -Temporalis  muscle injection, 4 sites, bilaterally. The first injection was 3 cm above the tragus of the ear, second injection site was 1.5 cm to 3 cm up from the first injection site in line with the tragus of the ear. The third injection site was 1.5-3 cm forward between the first 2 injection sites. The fourth injection site was 1.5 cm posterior to the second injection site. 5th site laterally in the temporalis  muscleat the level of the outer canthus.  -Occipitalis muscle injection, 3 sites, bilaterally. The first injection was done one half way between the occipital protuberance and the tip of the mastoid process behind the ear. The second injection site was done lateral and superior to the first, 1 fingerbreadth from the first injection. The third injection site was 1 fingerbreadth superiorly and medially from the first injection site.  -Cervical paraspinal muscle injection, 2 sites, bilaterally. The first injection site was 1 cm from the midline of the cervical spine, 3 cm inferior to the lower border of the occipital protuberance. The second injection site was 1.5 cm superiorly and laterally to the first injection site.  -Trapezius muscle injection was performed at 3 sites, bilaterally. The first injection site was in the upper trapezius muscle halfway between the inflection point of the neck, and the acromion. The second injection site was one half way between the acromion and the first injection site. The third injection was done between the first injection site and the inflection point of the neck.   Will return for repeat injection in 3 months.   A total of 200 units of Botox was prepared, 140 units of Botox was injected as documented above, any Botox not injected was wasted. The patient tolerated the procedure well, there were no complications of the above procedure.

## 2020-07-08 NOTE — Telephone Encounter (Signed)
Botox delivered today from [facility].

## 2020-08-05 ENCOUNTER — Other Ambulatory Visit: Payer: Self-pay

## 2020-08-05 ENCOUNTER — Ambulatory Visit
Admission: RE | Admit: 2020-08-05 | Discharge: 2020-08-05 | Disposition: A | Discharge status: Home / Self Care | Payer: 59 | Source: Ambulatory Visit | Attending: Adult Health | Admitting: Adult Health

## 2020-08-05 DIAGNOSIS — Z1231 Encounter for screening mammogram for malignant neoplasm of breast: Secondary | ICD-10-CM

## 2020-08-19 ENCOUNTER — Ambulatory Visit: Payer: 59 | Admitting: Family Medicine

## 2020-09-06 ENCOUNTER — Other Ambulatory Visit: Payer: Self-pay | Admitting: Adult Health

## 2020-09-08 ENCOUNTER — Other Ambulatory Visit: Payer: Self-pay | Admitting: Internal Medicine

## 2020-09-08 ENCOUNTER — Other Ambulatory Visit: Payer: Self-pay | Admitting: Neurology

## 2020-09-08 DIAGNOSIS — E785 Hyperlipidemia, unspecified: Secondary | ICD-10-CM

## 2020-09-08 DIAGNOSIS — Z0001 Encounter for general adult medical examination with abnormal findings: Secondary | ICD-10-CM

## 2020-11-10 ENCOUNTER — Ambulatory Visit: Payer: 59 | Admitting: Family Medicine

## 2020-11-12 ENCOUNTER — Telehealth: Payer: Self-pay | Admitting: Neurology

## 2020-11-12 NOTE — Telephone Encounter (Signed)
Received fax from [facility] stating that Botox is ready to be shipped, but patient needs to give consent. I called the patient and LVM to advise.

## 2020-11-12 NOTE — Telephone Encounter (Signed)
Patient has a Botox appointment on 1/18. I called [facility] to schedule delivery. I was transferred to Legrand Como, a pharmacist, because a diagnosis code was needed. I provided him with G43.711. He states he will mark the order as expedited.

## 2020-11-16 NOTE — Telephone Encounter (Signed)
I called [facility] and spoke with Romie Minus to check the status of Botox delivery. Romie Minus states that patient has still not given consent. Romie Minus placed me on hold to attempt to contact the patient, but was unable to to reach her. Romie Minus states she set up a tentative delivery date of 1/20.

## 2020-11-17 ENCOUNTER — Ambulatory Visit: Payer: 59 | Admitting: Neurology

## 2020-11-23 NOTE — Telephone Encounter (Signed)
Received approval from [facility] for Botox. PA #[id number] (11/23/20- 11/23/21).

## 2020-11-23 NOTE — Telephone Encounter (Signed)
Filled out PA via [facility] for [facility]. Waiting for response.

## 2020-12-01 ENCOUNTER — Other Ambulatory Visit: Payer: Self-pay | Admitting: Internal Medicine

## 2020-12-01 DIAGNOSIS — E785 Hyperlipidemia, unspecified: Secondary | ICD-10-CM

## 2020-12-01 DIAGNOSIS — Z0001 Encounter for general adult medical examination with abnormal findings: Secondary | ICD-10-CM

## 2021-04-14 ENCOUNTER — Other Ambulatory Visit: Payer: Self-pay | Admitting: Neurology

## 2021-04-14 ENCOUNTER — Other Ambulatory Visit: Payer: Self-pay | Admitting: Adult Health

## 2021-04-22 ENCOUNTER — Other Ambulatory Visit: Payer: Self-pay | Admitting: Neurology

## 2021-05-07 ENCOUNTER — Other Ambulatory Visit: Payer: Self-pay

## 2021-05-07 ENCOUNTER — Encounter: Payer: Self-pay | Admitting: Adult Health

## 2021-05-07 ENCOUNTER — Ambulatory Visit (INDEPENDENT_AMBULATORY_CARE_PROVIDER_SITE_OTHER): Payer: 59 | Admitting: Adult Health

## 2021-05-07 VITALS — BP 102/70 | HR 74 | Temp 97.4°F | Ht 63.0 in | Wt 103.2 lb

## 2021-05-07 DIAGNOSIS — Z13220 Encounter for screening for lipoid disorders: Secondary | ICD-10-CM

## 2021-05-07 DIAGNOSIS — H1013 Acute atopic conjunctivitis, bilateral: Secondary | ICD-10-CM

## 2021-05-07 DIAGNOSIS — T7840XD Allergy, unspecified, subsequent encounter: Secondary | ICD-10-CM

## 2021-05-07 DIAGNOSIS — Z79899 Other long term (current) drug therapy: Secondary | ICD-10-CM

## 2021-05-07 DIAGNOSIS — Z0001 Encounter for general adult medical examination with abnormal findings: Secondary | ICD-10-CM

## 2021-05-07 DIAGNOSIS — M1990 Unspecified osteoarthritis, unspecified site: Secondary | ICD-10-CM

## 2021-05-07 DIAGNOSIS — Z0000 Encounter for general adult medical examination without abnormal findings: Secondary | ICD-10-CM

## 2021-05-07 DIAGNOSIS — Z681 Body mass index (BMI) 19 or less, adult: Secondary | ICD-10-CM

## 2021-05-07 DIAGNOSIS — Z1389 Encounter for screening for other disorder: Secondary | ICD-10-CM

## 2021-05-07 DIAGNOSIS — E559 Vitamin D deficiency, unspecified: Secondary | ICD-10-CM

## 2021-05-07 DIAGNOSIS — Z131 Encounter for screening for diabetes mellitus: Secondary | ICD-10-CM

## 2021-05-07 DIAGNOSIS — E785 Hyperlipidemia, unspecified: Secondary | ICD-10-CM

## 2021-05-07 DIAGNOSIS — Z87898 Personal history of other specified conditions: Secondary | ICD-10-CM

## 2021-05-07 DIAGNOSIS — G43009 Migraine without aura, not intractable, without status migrainosus: Secondary | ICD-10-CM

## 2021-05-07 MED ORDER — RIZATRIPTAN BENZOATE 10 MG PO TBDP: ORAL_TABLET | ORAL | 2 refills | Status: DC

## 2021-05-07 MED ORDER — NURTEC 75 MG PO TBDP: ORAL_TABLET | ORAL | 5 refills | Status: DC

## 2021-05-07 MED ORDER — BEPOTASTINE BESILATE 1.5 % OP SOLN: 1.0000 [drp] | Freq: Two times a day (BID) | OPHTHALMIC | 1 refills | Status: DC | PRN

## 2021-05-07 NOTE — Patient Instructions (Addendum)
Goals      DIET - EAT MORE FRUITS AND VEGETABLES          Please check with insurance to see if they cover shingrix - 2 shots 2-6 months apart, can get a pharmacy    Iliotibial Band Syndrome Rehab Ask your health care provider which exercises are safe for you. Do exercises exactly as told by your health care provider and adjust them as directed. It is normal to feel mild stretching, pulling, tightness, or discomfort as you do these exercises. Stop right away if you feel sudden pain or your pain gets significantly worse. Do not begin these exercises until told by your health care provider. Stretching and range-of-motion exercises These exercises warm up your muscles and joints and improve the movement andflexibility of your hip and pelvis. Quadriceps stretch, prone  Lie on your abdomen (prone position) on a firm surface, such as a bed or padded floor. Bend your left / right knee and reach back to hold your ankle or pant leg. If you cannot reach your ankle or pant leg, loop a belt around your foot and grab the belt instead. Gently pull your heel toward your buttocks. Your knee should not slide out to the side. You should feel a stretch in the front of your thigh and knee (quadriceps). Hold this position for __________ seconds. Repeat __________ times. Complete this exercise __________ times a day. Iliotibial band stretch An iliotibial band is a strong band of muscle tissue that runs from the outer side of your hip to the outer side of your thigh and knee. Lie on your side with your left / right leg in the top position. Bend both of your knees and grab your left / right ankle. Stretch out your bottom arm to help you balance. Slowly bring your top knee back so your thigh goes behind your trunk. Slowly lower your top leg toward the floor until you feel a gentle stretch on the outside of your left / right hip and thigh. If you do not feel a stretch and your knee will not fall farther, place  the heel of your other foot on top of your knee and pull your knee down toward the floor with your foot. Hold this position for __________ seconds. Repeat __________ times. Complete this exercise __________ times a day. Strengthening exercises These exercises build strength and endurance in your hip and pelvis. Enduranceis the ability to use your muscles for a long time, even after they get tired. Straight leg raises, side-lying This exercise strengthens the muscles that rotate the leg at the hip and move it away from your body (hip abductors). Lie on your side with your left / right leg in the top position. Lie so your head, shoulder, hip, and knee line up. You may bend your bottom knee to help you balance. Roll your hips slightly forward so your hips are stacked directly over each other and your left / right knee is facing forward. Tense the muscles in your outer thigh and lift your top leg 4-6 inches (10-15 cm). Hold this position for __________ seconds. Slowly lower your leg to return to the starting position. Let your muscles relax completely before doing another repetition. Repeat __________ times. Complete this exercise __________ times a day. Leg raises, prone This exercise strengthens the muscles that move the hips backward (hip extensors). Lie on your abdomen (prone position) on your bed or a firm surface. You can put a pillow under your hips if that  is more comfortable for your lower back. Bend your left / right knee so your foot is straight up in the air. Squeeze your buttocks muscles and lift your left / right thigh off the bed. Do not let your back arch. Tense your thigh muscle as hard as you can without increasing any knee pain. Hold this position for __________ seconds. Slowly lower your leg to return to the starting position and allow it to relax completely. Repeat __________ times. Complete this exercise __________ times a day. Hip hike Stand sideways on a bottom step. Stand  on your left / right leg with your other foot unsupported next to the step. You can hold on to a railing or wall for balance if needed. Keep your knees straight and your torso square. Then lift your left / right hip up toward the ceiling. Slowly let your left / right hip lower toward the floor, past the starting position. Your foot should get closer to the floor. Do not lean or bend your knees. Repeat __________ times. Complete this exercise __________ times a day. This information is not intended to replace advice given to you by your health care provider. Make sure you discuss any questions you have with your healthcare provider. Document Revised: 12/25/2019 Document Reviewed: 12/25/2019 Elsevier Patient Education  [place].

## 2021-05-07 NOTE — Progress Notes (Signed)
Complete Physical  Assessment and Plan:  Encounter for general adult medical examination with abnormal findings Due annually  Health Maintenance reviewed Healthy lifestyle reviewed and goals set Patient to schedule mammogram later this year Check with insurance about shingrix  Migraine without aura and without status migrainosus, not intractable Avoid triggers, continue medications No longer getting botox Doing well with nurtec - will see if can get covered every other day as prophylactic Reduce stress/trigger Followed by neurology- recently PRN  Arthritis Monitor, mild, declines imaging or referral at this time   Allergic state, subsequent encounter Continue OTC allergy pills  Hyperlipidemia, unspecified hyperlipidemia type Continue medications: zetia Continue low cholesterol diet and exercise.  Check lipid panel.   Other abnormal glucose Discussed general issues about diabetes pathophysiology and management., Educational material distributed., Suggested low cholesterol diet., Encouraged aerobic exercise. - check A1C  Vitamin D deficiency Continue supplementation; discussed goal 60-100 -     VITAMIN D 25 Hydroxy (Vit-D Deficiency, Fractures)- defer due to cost  BMI <19 Stable, has been at this weight her whole life eating regular meals Continue to recommend diet heavy in fruits and veggies and low in animal meats, cheeses, and dairy products, appropriate calorie intake Discuss exercise recommendations routinely Continue to monitor weight at each visit  Left shoulder/left lateral knee pain Both suggestive of tendonitis on exam without hx of significant injury, no laxity on exam or joint effusion/crepitis Try icing, rest, meloxicam for 2 weeks Stretches given for lateral leg tightness Follow up if not improved and will plan on steroid taper  Orders Placed This Encounter  Procedures   CBC with Differential/Platelet   COMPLETE METABOLIC PANEL WITH GFR   Lipid panel    Hemoglobin A1c   Urinalysis, Routine w reflex microscopic    Discussed med's effects and SE's. Screening labs and tests as requested with regular follow-up as recommended.  Future Appointments  Date Time Provider [place]  05/10/2022 10:00 AM Liane Comber, NP [facility] None     HPI  60 y.o. female  presents for a complete physical. She has Allergy; Hyperlipidemia; Arthritis; Migraines; History of prediabetes; BMI less than 19,adult; and Vitamin D deficiency on their problem list.   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  Widowed, no children.   Had lab bill last year, will be more specific with labs.   She had difficult pelvic in office and had repeat pelvic/PAP by GYN [name], NP in 2019 which was unremarkable, recommended 5 year follow up due 2024.   She reports left shoulder posterior and deltoid pain x 1 week after she strained her shoulder applying lotion, has had pain with external rotation that doesn't seem to be not improving, also lateral left knee in tendon area with lifting knee, has meloxicam but hasn't tried.   She has a history of migraines, was seeing Dr. Lavell Anchors for headaches for botox but we started nurtec PRN with significant improvement. Felt some muscle weakness and wanted to limit, hasn't had since Jan 2021. Only having mild headaches recently. Having 4 days/month down from 21/month. Takes maxalt PRN.    BMI is Body mass index is 18.28 kg/m., she reports hasn't been exercising, plans to restart walking Eating more during the pandemic Admits to eating a lot of processed food, not enough fruits/veggies She reports drinks flavored water and green tea 1 bottle of diet mountain dew in the morning Does whey protein shake  Wt Readings from Last 3 Encounters:  05/07/21 103  lb 3.2 oz (46.8 kg)  06/25/20 101 lb (45.8 kg)  05/06/20 103 lb (46.7 kg)    Her blood pressure has been controlled at home, today their BP is BP:  102/70 She does not workout. She denies chest pain, shortness of breath, dizziness.   She is on cholesterol medication, zetia every day and denies myalgias. Her cholesterol is at goal. The cholesterol last visit was:   Lab Results  Component Value Date   CHOL 183 05/06/2020   HDL 67 05/06/2020   LDLCALC 99 05/06/2020   TRIG 83 05/06/2020   CHOLHDL 2.7 05/06/2020    Last A1C in the office was:  Lab Results  Component Value Date   HGBA1C 5.5 05/06/2020  .   Patient is on Vitamin D supplement, she is on 2000 IU daily Lab Results  Component Value Date   VD25OH 72 05/06/2020    She reports is taking a b12 suplement, has reduced dose since last check  Lab Results  Component Value Date   VITAMINB12 1,581 (H) 05/06/2020     Current Medications:  Current Outpatient Medications on File Prior to Visit  Medication Sig Dispense Refill   ezetimibe (ZETIA) 10 MG tablet TAKE 1 TABLET BY MOUTH EVERY DAY FOR CHOLESTEROL 90 tablet 1   meloxicam (MOBIC) 15 MG tablet TAKE 1/2 TO 1 TABLET DAILY WITH FOOD FOR PAIN & INFLAMMATION 90 tablet 0   fluticasone (FLONASE) 50 MCG/ACT nasal spray PLACE 1 SPRAY INTO BOTH NOSTRILS DAILY. (Patient not taking: Reported on 05/07/2021) 48 g 3   No current facility-administered medications on file prior to visit.   Health Maintenance:   Immunization History  Administered Date(s) Administered   Influenza Split 08/28/2013, 08/28/2014, 07/29/2015   Influenza-Unspecified 07/31/2018   Moderna Sars-Covid-2 Vaccination 12/23/2019, 01/21/2020   Td 07/12/2010   Tdap 07/29/2015   Tetanus: 2016 Pneumovax: N/A Prevnar 13: due 63 Flu vaccine: 2016, reminded due Oct 2022 Shingrix: - will check with insurance  Covid 19: 2/2, 2021, moderna + booster  Pap: 07/2018 at GYN after difficult pelvic in office; negative; due 2024 MGM: 08/05/2020 DEXA: no family history, on estrogen, Vitamin D due age 89 Colonoscopy: overdue, won't do colonoscopy Cologuard: 07/2018  negative EGD:  Last Dental Exam: Dr. Pollyann Savoy, 2022, goes q85m Last Eye Exam: Dr. Comer Locket at [facility] [place] [facility], 2022, wears glasses [place], last visit 2021  Patient Care Team: Unk Pinto, MD as PCP - General (Internal Medicine) Melvenia Beam, MD as Consulting Physician (Neurology)  Allergies: No Known Allergies Medical History:  Past Medical History:  Diagnosis Date   Allergy    Arthritis    Hyperlipidemia    Migraines    Surgical History:  Past Surgical History:  Procedure Laterality Date   LAPAROSCOPIC ABDOMINAL EXPLORATION  1990   Family History:  Patient's  family history includes Alzheimer's disease in her mother; Diabetes in her maternal uncle, paternal aunt, and paternal grandmother; Heart attack in her paternal uncle; Hyperlipidemia in her father and mother; Hypertension in her father and mother. Social History:  She  reports that she has never smoked. She has never used smokeless tobacco. She reports current alcohol use. She reports that she does not use drugs.  Review of Systems  Constitutional:  Negative for chills, fever, malaise/fatigue and weight loss.  HENT:  Negative for congestion, ear pain, hearing loss, sore throat and tinnitus.   Eyes: Negative.  Negative for blurred vision and double vision.  Respiratory:  Negative for cough, sputum production, shortness  of breath and wheezing.   Cardiovascular:  Negative for chest pain, palpitations, orthopnea, claudication, leg swelling and PND.  Gastrointestinal:  Negative for abdominal pain, blood in stool, constipation, diarrhea, heartburn, melena, nausea and vomiting.  Genitourinary: Negative.   Musculoskeletal:  Positive for joint pain (L knee lateral, in tendon above knee, left shoulder x 1 week). Negative for falls and myalgias.  Skin:  Negative for rash.  Neurological:  Positive for headaches. Negative for dizziness, tingling, sensory change, loss of consciousness and weakness.   Endo/Heme/Allergies:  Negative for polydipsia.  Psychiatric/Behavioral: Negative.  Negative for depression, memory loss, substance abuse and suicidal ideas. The patient is not nervous/anxious and does not have insomnia.   All other systems reviewed and are negative.  Physical Exam: Estimated body mass index is 18.28 kg/m as calculated from the following:   Height as of this encounter: 5\' 3"  (1.6 m).   Weight as of this encounter: 103 lb 3.2 oz (46.8 kg). BP 102/70   Pulse 74   Temp (!) 97.4 F (36.3 C)   Ht 5\' 3"  (1.6 m)   Wt 103 lb 3.2 oz (46.8 kg)   SpO2 98%   BMI 18.28 kg/m  General Appearance: Thin adult, in no apparent distress.  Eyes: PERRLA, EOMs, conjunctiva no swelling or erythema Sinuses: No Frontal/maxillary tenderness  ENT/Mouth: Ext aud canals clear, normal light reflex with TMs without erythema, bulging. Good dentition. No erythema, swelling, or exudate on post pharynx. Tonsils not swollen or erythematous. Hearing normal. Neck: Supple, thyroid normal. No bruits  Respiratory: Respiratory effort normal, BS equal bilaterally without rales, rhonchi, wheezing or stridor.  Cardio: RRR without murmurs, rubs or gallops. Brisk peripheral pulses without edema.  Chest: symmetric, with normal excursions and percussion.  Breasts: Declines manual exam today  Abdomen: Firm throughout/patient unable to relax, nontender, no guarding, rebound, palpable hernias, masses, or organomegaly. .  Lymphatics: Non tender without lymphadenopathy.  Genitourinary: Deferred to GYN, no concerns Musculoskeletal: Full ROM all peripheral extremities, 5/5 strength, and normal gait. No laxity, effusion of knee. Tenderness with palpation over distal lateral knee tendon. Left shoulder with pain with full external rotation.  Skin: Warm, dry without rashes, lesions, ecchymosis. Neuro: Cranial nerves intact, reflexes equal bilaterally. Normal muscle tone, no cerebellar symptoms. Sensation intact.  Psych: Awake  and oriented X 3, normal affect, Insight and Judgment appropriate.   EKG:  WNL, NSCPT, no ST changes in 2021, defer   Izora Ribas 10:19 AM [facility]

## 2021-05-08 LAB — CBC WITH DIFFERENTIAL/PLATELET
Absolute Monocytes: 410 cells/uL (ref 200–950)
Basophils Absolute: 68 cells/uL (ref 0–200)
Basophils Relative: 1.2 %
Eosinophils Absolute: 143 cells/uL (ref 15–500)
Eosinophils Relative: 2.5 %
HCT: 42 % (ref 35.0–45.0)
Hemoglobin: 13.5 g/dL (ref 11.7–15.5)
Lymphs Abs: 2035 cells/uL (ref 850–3900)
MCH: 29.2 pg (ref 27.0–33.0)
MCHC: 32.1 g/dL (ref 32.0–36.0)
MCV: 90.7 fL (ref 80.0–100.0)
MPV: 11.2 fL (ref 7.5–12.5)
Monocytes Relative: 7.2 %
Neutro Abs: 3044 cells/uL (ref 1500–7800)
Neutrophils Relative %: 53.4 %
Platelets: 237 10*3/uL (ref 140–400)
RBC: 4.63 10*6/uL (ref 3.80–5.10)
RDW: 12.6 % (ref 11.0–15.0)
Total Lymphocyte: 35.7 %
WBC: 5.7 10*3/uL (ref 3.8–10.8)

## 2021-05-08 LAB — COMPLETE METABOLIC PANEL WITH GFR
AG Ratio: 2.2 (calc) (ref 1.0–2.5)
ALT: 14 U/L (ref 6–29)
AST: 17 U/L (ref 10–35)
Albumin: 4.6 g/dL (ref 3.6–5.1)
Alkaline phosphatase (APISO): 110 U/L (ref 37–153)
BUN: 14 mg/dL (ref 7–25)
CO2: 31 mmol/L (ref 20–32)
Calcium: 9.6 mg/dL (ref 8.6–10.4)
Chloride: 103 mmol/L (ref 98–110)
Creat: 0.78 mg/dL (ref 0.50–0.99)
GFR, Est African American: 94 mL/min/{1.73_m2} (ref >=60)
GFR, Est Non African American: 81 mL/min/{1.73_m2} (ref >=60)
Globulin: 2.1 g/dL (calc) (ref 1.9–3.7)
Glucose, Bld: 95 mg/dL (ref 65–99)
Potassium: 4.4 mmol/L (ref 3.5–5.3)
Sodium: 141 mmol/L (ref 135–146)
Total Bilirubin: 0.4 mg/dL (ref 0.2–1.2)
Total Protein: 6.7 g/dL (ref 6.1–8.1)

## 2021-05-08 LAB — URINALYSIS, ROUTINE W REFLEX MICROSCOPIC
Bilirubin Urine: NEGATIVE
Glucose, UA: NEGATIVE
Hgb urine dipstick: NEGATIVE
Ketones, ur: NEGATIVE
Leukocytes,Ua: NEGATIVE
Nitrite: NEGATIVE
Protein, ur: NEGATIVE
Specific Gravity, Urine: 1.014 (ref 1.001–1.035)
pH: 6.5 (ref 5.0–8.0)

## 2021-05-08 LAB — LIPID PANEL
Cholesterol: 198 mg/dL (ref <200)
HDL: 71 mg/dL (ref >=50)
LDL Cholesterol (Calc): 106 mg/dL (calc) — ABNORMAL HIGH
Non-HDL Cholesterol (Calc): 127 mg/dL (calc) (ref <130)
Total CHOL/HDL Ratio: 2.8 (calc) (ref <5.0)
Triglycerides: 112 mg/dL (ref <150)

## 2021-05-08 LAB — HEMOGLOBIN A1C
Hgb A1c MFr Bld: 5.5 % of total Hgb (ref <5.7)
Mean Plasma Glucose: 111 mg/dL
eAG (mmol/L): 6.2 mmol/L

## 2021-05-21 ENCOUNTER — Other Ambulatory Visit: Payer: Self-pay | Admitting: Adult Health

## 2021-05-21 MED ORDER — PREDNISONE 20 MG PO TABS: ORAL_TABLET | ORAL | 0 refills | Status: AC

## 2021-05-24 ENCOUNTER — Other Ambulatory Visit: Payer: Self-pay | Admitting: Adult Health

## 2021-05-24 DIAGNOSIS — Z1231 Encounter for screening mammogram for malignant neoplasm of breast: Secondary | ICD-10-CM

## 2021-06-15 ENCOUNTER — Other Ambulatory Visit: Payer: Self-pay | Admitting: Adult Health

## 2021-06-15 DIAGNOSIS — M25512 Pain in left shoulder: Secondary | ICD-10-CM

## 2021-06-22 ENCOUNTER — Other Ambulatory Visit: Payer: Self-pay | Admitting: Adult Health

## 2021-06-22 DIAGNOSIS — M25512 Pain in left shoulder: Secondary | ICD-10-CM

## 2021-06-27 ENCOUNTER — Other Ambulatory Visit: Payer: Self-pay | Admitting: Adult Health

## 2021-06-27 DIAGNOSIS — E785 Hyperlipidemia, unspecified: Secondary | ICD-10-CM

## 2021-06-27 DIAGNOSIS — Z0001 Encounter for general adult medical examination with abnormal findings: Secondary | ICD-10-CM

## 2021-07-20 ENCOUNTER — Other Ambulatory Visit: Payer: Self-pay | Admitting: Adult Health

## 2021-07-20 DIAGNOSIS — Z1211 Encounter for screening for malignant neoplasm of colon: Secondary | ICD-10-CM

## 2021-07-20 MED ORDER — IBUPROFEN 800 MG PO TABS: ORAL_TABLET | ORAL | 0 refills | Status: DC

## 2021-07-28 ENCOUNTER — Other Ambulatory Visit: Payer: Self-pay | Admitting: Adult Health

## 2021-08-06 ENCOUNTER — Ambulatory Visit
Admission: RE | Admit: 2021-08-06 | Discharge: 2021-08-06 | Disposition: A | Discharge status: Home / Self Care | Payer: 59 | Source: Ambulatory Visit | Attending: Adult Health | Admitting: Adult Health

## 2021-08-06 ENCOUNTER — Other Ambulatory Visit: Payer: Self-pay

## 2021-08-06 DIAGNOSIS — Z1231 Encounter for screening mammogram for malignant neoplasm of breast: Secondary | ICD-10-CM

## 2021-08-12 ENCOUNTER — Other Ambulatory Visit: Payer: Self-pay | Admitting: Adult Health

## 2021-09-03 LAB — COLOGUARD: COLOGUARD: NEGATIVE

## 2021-10-08 ENCOUNTER — Other Ambulatory Visit: Payer: Self-pay | Admitting: Adult Health

## 2021-10-19 ENCOUNTER — Encounter: Payer: Self-pay | Admitting: Adult Health

## 2022-01-03 ENCOUNTER — Encounter: Payer: Self-pay | Admitting: Adult Health

## 2022-03-19 ENCOUNTER — Encounter (HOSPITAL_BASED_OUTPATIENT_CLINIC_OR_DEPARTMENT_OTHER): Payer: Self-pay | Admitting: Emergency Medicine

## 2022-03-19 ENCOUNTER — Emergency Department (HOSPITAL_BASED_OUTPATIENT_CLINIC_OR_DEPARTMENT_OTHER)
Admission: EM | Admit: 2022-03-19 | Discharge: 2022-03-19 | Disposition: A | Discharge status: Home / Self Care | Payer: 59 | Attending: Emergency Medicine | Admitting: Emergency Medicine

## 2022-03-19 ENCOUNTER — Emergency Department (HOSPITAL_BASED_OUTPATIENT_CLINIC_OR_DEPARTMENT_OTHER): Payer: 59 | Admitting: Radiology

## 2022-03-19 DIAGNOSIS — Y9373 Activity, racquet and hand sports: Secondary | ICD-10-CM | POA: Diagnosis not present

## 2022-03-19 DIAGNOSIS — W010XXA Fall on same level from slipping, tripping and stumbling without subsequent striking against object, initial encounter: Secondary | ICD-10-CM | POA: Insufficient documentation

## 2022-03-19 DIAGNOSIS — S42201A Unspecified fracture of upper end of right humerus, initial encounter for closed fracture: Secondary | ICD-10-CM | POA: Insufficient documentation

## 2022-03-19 DIAGNOSIS — S4991XA Unspecified injury of right shoulder and upper arm, initial encounter: Secondary | ICD-10-CM | POA: Diagnosis present

## 2022-03-19 DIAGNOSIS — S42211A Unspecified displaced fracture of surgical neck of right humerus, initial encounter for closed fracture: Secondary | ICD-10-CM | POA: Diagnosis not present

## 2022-03-19 DIAGNOSIS — Y9239 Other specified sports and athletic area as the place of occurrence of the external cause: Secondary | ICD-10-CM | POA: Diagnosis not present

## 2022-03-19 MED ORDER — KETOROLAC TROMETHAMINE 30 MG/ML IJ SOLN: 30.0000 mg | Freq: Once | INTRAMUSCULAR | Status: DC

## 2022-03-19 MED ORDER — OXYCODONE-ACETAMINOPHEN 5-325 MG PO TABS
1.0000 | ORAL_TABLET | Freq: Once | ORAL | Status: AC
  Administered 2022-03-19: 1 via ORAL
  Filled 2022-03-19: qty 1

## 2022-03-19 MED ORDER — OXYCODONE HCL 5 MG PO TABS: 5.0000 mg | ORAL_TABLET | ORAL | 0 refills | Status: AC | PRN

## 2022-03-19 NOTE — ED Triage Notes (Signed)
Pt playing pickleball this morning, tripped and fell on right shoulder. Pt reports she is unable to move her right arm without severe pain.

## 2022-03-19 NOTE — ED Provider Notes (Signed)
[facility] Provider Note   CSN: [id number] Arrival date & time: 03/19/22  1012     History  Chief Complaint  Patient presents with   Shoulder Pain    Heidi Stone is a 61 y.o. female.  Presented to the emergency department due to concern for fall while playing pickle ball this morning.  Mechanical fall, landed on her right shoulder.  She denies hitting her head, no LOC.  Pain is isolated to her right shoulder region.  Worse with movement improved with rest.  No numbness or tingling or weakness noted.  She denies any major medical problems.  Not on blood thinners.  Accompanied by her friend and pickleball partner.  HPI     Home Medications Prior to Admission medications   Medication Sig Start Date End Date Taking? Authorizing Provider  oxyCODONE (ROXICODONE) 5 MG immediate release tablet Take 1 tablet (5 mg total) by mouth every 4 (four) hours as needed for up to 3 days for severe pain. 03/19/22 03/22/22 Yes Nera Haworth, Ellwood Dense, MD  Bepotastine Besilate 1.5 % SOLN Place 1 drop into both eyes 2 (two) times daily as needed. 05/07/21   Liane Comber, NP  ezetimibe (ZETIA) 10 MG tablet Take  1 tablet  Daily for Cholesterol  / Patient knows to take by mouth 06/27/21   Unk Pinto, MD  fluticasone (FLONASE) 50 MCG/ACT nasal spray PLACE 1 SPRAY INTO BOTH NOSTRILS DAILY. Patient not taking: Reported on 05/07/2021 04/24/17   Vladimir Crofts, PA-C  ibuprofen (ADVIL) 800 MG tablet TAKE 1 TABLET THREE TIMES A DAY WITH FOOD AS NEEDED 08/12/21   Magda Bernheim, NP  Rimegepant Sulfate (NURTEC) 75 MG TBDP TAKE 1 TAB ONCE PER DAY AS NEEDED WITH ONSET OF MIGRAINE HEADACHE. 10/08/21   Liane Comber, NP  rizatriptan (MAXALT-MLT) 10 MG disintegrating tablet DISSOLVE 1 TABLET UNDER TONGUE AS NEEDED FOR MIGRAINE 05/07/21   Liane Comber, NP      Allergies    Patient has no known allergies.    Review of Systems   Review of Systems  Musculoskeletal:  Positive for  arthralgias.  All other systems reviewed and are negative.  Physical Exam Updated Vital Signs BP 96/60 (BP Location: Left Arm)   Pulse 65   Temp 97.9 F (36.6 C) (Oral)   Resp 12   Ht '5\' 3"'$  (1.6 m)   Wt 48 kg   SpO2 100%   BMI 18.76 kg/m  Physical Exam Vitals and nursing note reviewed.  Constitutional:      General: She is not in acute distress.    Appearance: She is well-developed.  HENT:     Head: Normocephalic and atraumatic.  Eyes:     Conjunctiva/sclera: Conjunctivae normal.  Cardiovascular:     Rate and Rhythm: Normal rate and regular rhythm.     Heart sounds: No murmur heard. Pulmonary:     Effort: Pulmonary effort is normal. No respiratory distress.     Breath sounds: Normal breath sounds.  Abdominal:     Palpations: Abdomen is soft.     Tenderness: There is no abdominal tenderness.  Musculoskeletal:        General: No swelling.     Cervical back: Neck supple.     Comments: Back: no C, T, L spine TTP, no step off or deformity RUE: Tenderness to palpation noted to the shoulder, proximal humerus region, radial pulse intact, distal sensation and motor intact LUE: no TTP throughout, no deformity, normal joint ROM, radial  pulse intact, distal sensation and motor intact RLE:  no TTP throughout, no deformity, normal joint ROM, distal pulse, sensation and motor intact LLE: no TTP throughout, no deformity, normal joint ROM, distal pulse, sensation and motor intact  Skin:    General: Skin is warm and dry.     Capillary Refill: Capillary refill takes less than 2 seconds.  Neurological:     Mental Status: She is alert.  Psychiatric:        Mood and Affect: Mood normal.    ED Results / Procedures / Treatments   Labs (all labs ordered are listed, but only abnormal results are displayed) Labs Reviewed - No data to display  EKG None  Radiology DG Clavicle Right  Result Date: 03/19/2022 CLINICAL DATA:  Pain after fall EXAM: RIGHT CLAVICLE - 2+ VIEWS COMPARISON:   None Available. FINDINGS: Comminuted fracture through the right humeral head and neck. Limited views of the scapular intact. The clavicle is normal with no fracture identified. Limited views of the right chest are normal. IMPRESSION: Comminuted fracture through the right humeral head and neck. No clavicular fracture identified. Electronically Signed   By: Dorise Bullion III M.D.   On: 03/19/2022 12:05   DG Humerus Right  Result Date: 03/19/2022 CLINICAL DATA:  Pain after fall EXAM: RIGHT HUMERUS - 2+ VIEW COMPARISON:  None Available. FINDINGS: There is a comminuted fracture through the right humeral head neck. The remainder of the humerus is intact. No shoulder dislocation identified. No other acute abnormalities. IMPRESSION: Comminuted fracture through the right humeral head neck without dislocation or significant displacement. Electronically Signed   By: Dorise Bullion III M.D.   On: 03/19/2022 12:04    Procedures Procedures    Medications Ordered in ED Medications  oxyCODONE-acetaminophen (PERCOCET/ROXICET) 5-325 MG per tablet 1 tablet (1 tablet Oral Given 03/19/22 1100)    ED Course/ Medical Decision Making/ A&P                           Medical Decision Making Amount and/or Complexity of Data Reviewed Radiology: ordered.  Risk Prescription drug management.   61 year old lady presents to ER due to concern for fall on pickleball court and right shoulder injury.  I independently reviewed and interpreted x-ray results, concern for proximal humerus fracture.  No other traumatic findings.  No dislocation.  Will place patient in sling immobilizer, instructed patient to follow-up with orthopedics.  Improved pain with Percocet in ER.  Recommended Tylenol Motrin for pain control at home and gave Rx for oxycodone for breakthrough pain at home.  Reviewed return precautions, recommend nonweightbearing for now, discharged.    After the discussed management above, the patient was determined to  be safe for discharge.  The patient was in agreement with this plan and all questions regarding their care were answered.  ED return precautions were discussed and the patient will return to the ED with any significant worsening of condition.         Final Clinical Impression(s) / ED Diagnoses Final diagnoses:  Closed fracture of proximal end of right humerus, unspecified fracture morphology, initial encounter    Rx / DC Orders ED Discharge Orders          Ordered    oxyCODONE (ROXICODONE) 5 MG immediate release tablet  Every 4 hours PRN        03/19/22 1319              Krysti Hickling, Ellwood Dense,  MD 03/20/22 0710

## 2022-03-19 NOTE — Discharge Instructions (Signed)
Recommend no weightbearing in your right arm for now, use sling as directed.  Follow-up with orthopedic surgery regarding your fracture of your proximal humerus bone.  Take Tylenol and Motrin as needed for pain control.  For breakthrough pain I would recommend taking the prescribed oxycodone as needed.  Please note this can make you drowsy.

## 2022-03-24 ENCOUNTER — Encounter: Payer: Self-pay | Admitting: Adult Health

## 2022-04-14 ENCOUNTER — Encounter: Payer: Self-pay | Admitting: Internal Medicine

## 2022-04-14 ENCOUNTER — Other Ambulatory Visit: Payer: Self-pay | Admitting: Adult Health

## 2022-05-09 ENCOUNTER — Encounter: Payer: 59 | Admitting: Nurse Practitioner

## 2022-05-10 ENCOUNTER — Encounter: Payer: 59 | Admitting: Adult Health

## 2022-05-23 ENCOUNTER — Ambulatory Visit (INDEPENDENT_AMBULATORY_CARE_PROVIDER_SITE_OTHER): Payer: 59 | Admitting: Nurse Practitioner

## 2022-05-23 ENCOUNTER — Encounter: Payer: Self-pay | Admitting: Nurse Practitioner

## 2022-05-23 VITALS — BP 130/76 | HR 77 | Temp 97.1°F | Ht 63.0 in | Wt 108.6 lb

## 2022-05-23 DIAGNOSIS — Z0000 Encounter for general adult medical examination without abnormal findings: Secondary | ICD-10-CM

## 2022-05-23 DIAGNOSIS — Z0001 Encounter for general adult medical examination with abnormal findings: Secondary | ICD-10-CM

## 2022-05-23 DIAGNOSIS — R7309 Other abnormal glucose: Secondary | ICD-10-CM

## 2022-05-23 DIAGNOSIS — M1990 Unspecified osteoarthritis, unspecified site: Secondary | ICD-10-CM

## 2022-05-23 DIAGNOSIS — S42294S Other nondisplaced fracture of upper end of right humerus, sequela: Secondary | ICD-10-CM

## 2022-05-23 DIAGNOSIS — E785 Hyperlipidemia, unspecified: Secondary | ICD-10-CM

## 2022-05-23 DIAGNOSIS — Z79899 Other long term (current) drug therapy: Secondary | ICD-10-CM

## 2022-05-23 DIAGNOSIS — J302 Other seasonal allergic rhinitis: Secondary | ICD-10-CM

## 2022-05-23 DIAGNOSIS — Z681 Body mass index (BMI) 19 or less, adult: Secondary | ICD-10-CM

## 2022-05-23 DIAGNOSIS — Z1389 Encounter for screening for other disorder: Secondary | ICD-10-CM

## 2022-05-23 DIAGNOSIS — G43009 Migraine without aura, not intractable, without status migrainosus: Secondary | ICD-10-CM

## 2022-05-23 DIAGNOSIS — E559 Vitamin D deficiency, unspecified: Secondary | ICD-10-CM

## 2022-05-23 NOTE — Progress Notes (Signed)
Complete Physical  Assessment and Plan:  Encounter for general adult medical examination with abnormal findings Due annually  Health Maintenance reviewed Healthy lifestyle reviewed and goals set  Migraine without aura and without status migrainosus, not intractable Managed with Nurtec Avoid triggers, continue medications No longer getting botox Followed by neurology PRN  Arthritis Currently following with Orthopedics Managed with Tylenol and IBU  Allergic state, subsequent encounter Suggested OTC Zyrtec. Sample Provided. Referral to ENT if symptoms persists.  Hyperlipidemia, unspecified hyperlipidemia type Continue medications;  Discussed lifestyle modifications. Recommended diet heavy in fruits and veggies, omega 3's. Decrease consumption of animal meats, cheeses, and dairy products. Remain active and exercise as tolerated. Continue to monitor.   Other abnormal glucose Past elevation reviewed. Continue lifestyle modifications to keep risk for DM. Continue to monitor Check A1C levels   Vitamin D deficiency Continue supplement. Check Vitamin D levels  BMI <19 Stable, has been at this weight her whole life eating regular meals Continue to recommend diet heavy in fruits and veggies and low in animal meats, cheeses, and dairy products, appropriate calorie intake Discuss exercise recommendations routinely Continue to monitor weight at each visit  Medication management All medications discussed and reviewed in full. All questions and concerns regarding medications addressed.    Screening for proteinuria or hematuria. -UA  Other closed nondisplaced fracture of proximal end of right humerus, sequela Right arm sling intact. Continue PT  Continue to follow with Orthopedics as directed. Continue to monitor   Orders Placed This Encounter  Procedures   CBC with Differential/Platelet   COMPLETE METABOLIC PANEL WITH GFR   Lipid panel   Hemoglobin A1c   VITAMIN D  25 Hydroxy (Vit-D Deficiency, Fractures)   Urinalysis, Routine w reflex microscopic     Discussed med's effects and SE's. Screening labs and tests as requested with regular follow-up as recommended.  Future Appointments  Date Time Provider [place]  05/24/2023  2:00 PM Darrol Jump, NP [facility] None     HPI  61 y.o. female  presents for a complete physical. She has Allergy; Hyperlipidemia; Arthritis; Migraines; History of prediabetes; BMI less than 19,adult; Vitamin D deficiency; and Closed fracture of proximal end of right humerus on their problem list.   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  Widowed, no children.   She has been complaining of increase in nasal congestion and feeling stopped up on the right side of her nostril..  She feels as though her turbinate is swollen.  She has been using Flonase with some effectiveness.  She does not take a daily antihistamine.  Broke right humerus in 02/2022.  She is in a right sling.  Has PT scheduled for twice a week for 4 weeks.  She is following with Orthopedics.  Pain is managed with Tylenol and IBU.  She has a history of migraines, was seeing Dr. Lavell Anchors for headaches for botox but we started nurtec PRN with significant improvement. Only having mild headaches recently. Having 4 days/month down from 21/month. Takes maxalt PRN.    BMI is Body mass index is 19.24 kg/m., she reports hasn't been exercising, plans to restart walking Eating more during the pandemic Admits to eating a lot of processed food, not enough fruits/veggies She reports drinks flavored water and green tea 1 bottle of diet mountain dew in the morning Does whey protein shake  Wt Readings from Last 3 Encounters:  05/23/22 108 lb 9.6 oz (49.3 kg)  03/19/22 105 lb 14.4 oz (  48 kg)  05/07/21 103 lb 3.2 oz (46.8 kg)    Her blood pressure has been controlled at home, today their BP is BP: 130/76 She does not workout. She denies  chest pain, shortness of breath, dizziness.   She is on cholesterol medication, zetia every day and denies myalgias. Her cholesterol is at goal. The cholesterol last visit was:   Lab Results  Component Value Date   CHOL 198 05/07/2021   HDL 71 05/07/2021   LDLCALC 106 (H) 05/07/2021   TRIG 112 05/07/2021   CHOLHDL 2.8 05/07/2021    Last A1C in the office was:  Lab Results  Component Value Date   HGBA1C 5.5 05/07/2021  .   Patient is on Vitamin D supplement, she is on 2000 IU daily Lab Results  Component Value Date   VD25OH 72 05/06/2020    She reports is taking a b12 suplement, has reduced dose since last check  Lab Results  Component Value Date   VITAMINB12 1,581 (H) 05/06/2020     Current Medications:  Current Outpatient Medications on File Prior to Visit  Medication Sig Dispense Refill   Bepotastine Besilate 1.5 % SOLN Place 1 drop into both eyes 2 (two) times daily as needed. 10 mL 1   ezetimibe (ZETIA) 10 MG tablet Take  1 tablet  Daily for Cholesterol  / Patient knows to take by mouth 90 tablet 3   fluticasone (FLONASE) 50 MCG/ACT nasal spray PLACE 1 SPRAY INTO BOTH NOSTRILS DAILY. 48 g 3   ibuprofen (ADVIL) 800 MG tablet TAKE 1 TABLET THREE TIMES A DAY WITH FOOD AS NEEDED 90 tablet 0   NURTEC 75 MG TBDP TAKE 1 TAB ONCE PER DAY AS NEEDED WITH ONSET OF MIGRAINE HEADACHE. 8 tablet 5   rizatriptan (MAXALT-MLT) 10 MG disintegrating tablet DISSOLVE 1 TABLET UNDER TONGUE AS NEEDED FOR MIGRAINE 9 tablet 2   No current facility-administered medications on file prior to visit.   Health Maintenance:   Immunization History  Administered Date(s) Administered   Influenza Split 08/28/2013, 08/28/2014, 07/29/2015   Influenza-Unspecified 07/31/2018   Moderna SARS-COV2 Booster Vaccination 10/16/2020   Moderna Sars-Covid-2 Vaccination 12/23/2019, 01/21/2020   Td 07/12/2010   Tdap 07/29/2015   Tetanus: 2016 Pneumovax: N/A Prevnar 13: due 63 Flu vaccine: 07/2021 Shingrix: -  due Covid 19: 2/2, 2021, moderna + booster  Pap: 1ue 2024 MGM: 08/05/2021 DEXA: no family history, on estrogen, Vitamin D due age 89 Colonoscopy: declines colonoscopy Cologuard: 07/2021 negative - 3 yr recall EGD:  Last Dental Exam: Dr. Estill Bamberg [place], 72, goes q76mLast Eye Exam: Dr. [place]er [place] 2023, wears glasses [place] last visit 2021  Patient Care Team: MUnk Pinto MD as PCP - General (Internal Medicine) AMelvenia Beam MD as Consulting Physician (Neurology)  Allergies: No Known Allergies Medical History:  Past Medical History:  Diagnosis Date   Allergy    Arthritis    Hyperlipidemia    Migraines    Surgical History:  Past Surgical History:  Procedure Laterality Date   LAPAROSCOPIC ABDOMINAL EXPLORATION  1990   Family History:  Patient's  family history includes Alzheimer's disease in her mother; Diabetes in her maternal uncle, paternal aunt, and paternal grandmother; Heart attack in her paternal uncle; Hyperlipidemia in her father and mother; Hypertension in her father and mother. Social History:  She  reports that she has never smoked. She has never used smokeless tobacco. She reports current alcohol use. She reports that she does not use drugs.  Review of Systems  Constitutional:  Negative for chills, fever, malaise/fatigue and weight loss.  HENT:  Negative for congestion, ear pain, hearing loss, sore throat and tinnitus.   Eyes: Negative.  Negative for blurred vision and double vision.  Respiratory:  Negative for cough, sputum production, shortness of breath and wheezing.   Cardiovascular:  Negative for chest pain, palpitations, orthopnea, claudication, leg swelling and PND.  Gastrointestinal:  Negative for abdominal pain, blood in stool, constipation, diarrhea, heartburn, melena, nausea and vomiting.  Genitourinary: Negative.   Musculoskeletal:  Positive for joint pain (right shoulder pain, sling). Negative for falls and  myalgias.  Skin:  Negative for rash.  Neurological:  Positive for headaches. Negative for dizziness, tingling, sensory change, loss of consciousness and weakness.  Endo/Heme/Allergies:  Negative for polydipsia.  Psychiatric/Behavioral: Negative.  Negative for depression, memory loss, substance abuse and suicidal ideas. The patient is not nervous/anxious and does not have insomnia.   All other systems reviewed and are negative.   Physical Exam: Estimated body mass index is 19.24 kg/m as calculated from the following:   Height as of this encounter: '5\' 3"'$  (1.6 m).   Weight as of this encounter: 108 lb 9.6 oz (49.3 kg). BP 130/76   Pulse 77   Temp (!) 97.1 F (36.2 C)   Ht '5\' 3"'$  (1.6 m)   Wt 108 lb 9.6 oz (49.3 kg)   SpO2 99%   BMI 19.24 kg/m  General Appearance: Thin adult, in no apparent distress.  Eyes: PERRLA, EOMs, conjunctiva no swelling or erythema Sinuses: No Frontal/maxillary tenderness  ENT/Mouth: Ext aud canals clear, normal light reflex with TMs without erythema, bulging. Good dentition. No erythema, swelling, or exudate on post pharynx. Tonsils not swollen or erythematous. Hearing normal. Neck: Supple, thyroid normal. No bruits  Respiratory: Respiratory effort normal, BS equal bilaterally without rales, rhonchi, wheezing or stridor.  Cardio: RRR without murmurs, rubs or gallops. Brisk peripheral pulses without edema.  Chest: symmetric, with normal excursions and percussion.  Breasts: Declines manual exam today  Abdomen: Firm throughout/patient unable to relax, nontender, no guarding, rebound, palpable hernias, masses, or organomegaly. .  Lymphatics: Non tender without lymphadenopathy.  Genitourinary: Deferred to GYN, no concerns Musculoskeletal: LROM in RUE d/t sling. Skin: Warm, dry without rashes, lesions, ecchymosis. Neuro: Cranial nerves intact, reflexes equal bilaterally. Normal muscle tone, no cerebellar symptoms. Sensation intact.  Psych: Awake and oriented X 3,  normal affect, Insight and Judgment appropriate.   EKG:  WNL, NSCPT, no ST changes in 2021, defer   Lolamae Voisin 4:16 PM [facility]

## 2022-05-23 NOTE — Patient Instructions (Signed)
Allergic Rhinitis, Adult  Allergic rhinitis is an allergic reaction that affects the mucous membrane inside the nose. The mucous membrane is the tissue that produces mucus. There are two types of allergic rhinitis: Seasonal. This type is also called hay fever and happens only during certain seasons. Perennial. This type can happen at any time of the year. Allergic rhinitis cannot be spread from person to person. This condition can be mild, moderate, or severe. It can develop at any age and may be outgrown. What are the causes? This condition is caused by allergens. These are things that can cause an allergic reaction. Allergens may differ for seasonal allergic rhinitis and perennial allergic rhinitis. Seasonal allergic rhinitis is triggered by pollen. Pollen can come from grasses, trees, and weeds. Perennial allergic rhinitis may be triggered by: Dust mites. Proteins in a pet's urine, saliva, or dander. Dander is dead skin cells from a pet. Smoke, mold, or car fumes. What increases the risk? You are more likely to develop this condition if you have a family history of allergies or other conditions related to allergies, including: Allergic conjunctivitis. This is inflammation of parts of the eyes and eyelids. Asthma. This condition affects the lungs and makes it hard to breathe. Atopic dermatitis or eczema. This is long term (chronic) inflammation of the skin. Food allergies. What are the signs or symptoms? Symptoms of this condition include: Sneezing or coughing. A stuffy nose (nasal congestion), itchy nose, or nasal discharge. Itchy eyes and tearing of the eyes. A feeling of mucus dripping down the back of your throat (postnasal drip). Trouble sleeping. Tiredness or fatigue. Headache. Sore throat. How is this diagnosed? This condition may be diagnosed with your symptoms, medical history, and physical exam. Your health care provider may check for related conditions, such  as: Asthma. Pink eye. This is eye inflammation caused by infection (conjunctivitis). Ear infection. Upper respiratory infection. This is an infection in the nose, throat, or upper airways. You may also have tests to find out which allergens trigger your symptoms. These may include skin tests or blood tests. How is this treated? There is no cure for this condition, but treatment can help control symptoms. Treatment may include: Taking medicines that block allergy symptoms, such as corticosteroids and antihistamines. Medicine may be given as a shot, nasal spray, or pill. Avoiding any allergens. Being exposed again and again to tiny amounts of allergens to help you build a defense against allergens (immunotherapy). This is done if other treatments have not helped. It may include: Allergy shots. These are injected medicines that have small amounts of allergen in them. Sublingual immunotherapy. This involves taking small doses of a medicine with allergen in it under your tongue. If these treatments do not work, your health care provider may prescribe newer, stronger medicines. Follow these instructions at home: Avoiding allergens Find out what you are allergic to and avoid those allergens. These are some things you can do to help avoid allergens: If you have perennial allergies: Replace carpet with wood, tile, or vinyl flooring. Carpet can trap dander and dust. Do not smoke. Do not allow smoking in your home. Change your heating and air conditioning filters at least once a month. If you have seasonal allergies, take these steps during allergy season: Keep windows closed as much as possible. Plan outdoor activities when pollen counts are lowest. Check pollen counts before you plan outdoor activities. When coming indoors, change clothing and shower before sitting on furniture or bedding. If you have a pet   in the house that produces allergens: Keep the pet out of the bedroom. Vacuum, sweep, and  dust regularly. General instructions Take over-the-counter and prescription medicines only as told by your health care provider. Drink enough fluid to keep your urine pale yellow. Keep all follow-up visits as told by your health care provider. This is important. Where to find more information American Academy of Allergy, Asthma & Immunology: www.aaaai.org Contact a health care provider if: You have a fever. You develop a cough that does not go away. You make whistling sounds when you breathe (wheeze). Your symptoms slow you down or stop you from doing your normal activities each day. Get help right away if: You have shortness of breath. This symptom may represent a serious problem that is an emergency. Do not wait to see if the symptom will go away. Get medical help right away. Call your local emergency services (911 in the [place] Do not drive yourself to the hospital. Summary Allergic rhinitis may be managed by taking medicines as directed and avoiding allergens. If you have seasonal allergies, keep windows closed as much as possible during allergy season. Contact your health care provider if you develop a fever or a cough that does not go away. This information is not intended to replace advice given to you by your health care provider. Make sure you discuss any questions you have with your health care provider. Document Revised: 12/06/2019 Document Reviewed: 10/15/2019 Elsevier Patient Education  [place].

## 2022-05-24 LAB — COMPLETE METABOLIC PANEL WITH GFR
AG Ratio: 2 (calc) (ref 1.0–2.5)
ALT: 10 U/L (ref 6–29)
AST: 12 U/L (ref 10–35)
Albumin: 4.6 g/dL (ref 3.6–5.1)
Alkaline phosphatase (APISO): 98 U/L (ref 37–153)
BUN: 17 mg/dL (ref 7–25)
CO2: 26 mmol/L (ref 20–32)
Calcium: 9.8 mg/dL (ref 8.6–10.4)
Chloride: 104 mmol/L (ref 98–110)
Creat: 0.67 mg/dL (ref 0.50–1.05)
Globulin: 2.3 g/dL (calc) (ref 1.9–3.7)
Glucose, Bld: 93 mg/dL (ref 65–99)
Potassium: 4.3 mmol/L (ref 3.5–5.3)
Sodium: 141 mmol/L (ref 135–146)
Total Bilirubin: 0.4 mg/dL (ref 0.2–1.2)
Total Protein: 6.9 g/dL (ref 6.1–8.1)
eGFR: 98 mL/min/{1.73_m2} (ref >=60)

## 2022-05-24 LAB — HEMOGLOBIN A1C
Hgb A1c MFr Bld: 5.5 % of total Hgb (ref <5.7)
Mean Plasma Glucose: 111 mg/dL
eAG (mmol/L): 6.2 mmol/L

## 2022-05-24 LAB — CBC WITH DIFFERENTIAL/PLATELET
Absolute Monocytes: 315 cells/uL (ref 200–950)
Basophils Absolute: 60 cells/uL (ref 0–200)
Basophils Relative: 1.2 %
Eosinophils Absolute: 150 cells/uL (ref 15–500)
Eosinophils Relative: 3 %
HCT: 38.6 % (ref 35.0–45.0)
Hemoglobin: 12.9 g/dL (ref 11.7–15.5)
Lymphs Abs: 1815 cells/uL (ref 850–3900)
MCH: 30.6 pg (ref 27.0–33.0)
MCHC: 33.4 g/dL (ref 32.0–36.0)
MCV: 91.7 fL (ref 80.0–100.0)
MPV: 10.4 fL (ref 7.5–12.5)
Monocytes Relative: 6.3 %
Neutro Abs: 2660 cells/uL (ref 1500–7800)
Neutrophils Relative %: 53.2 %
Platelets: 265 10*3/uL (ref 140–400)
RBC: 4.21 10*6/uL (ref 3.80–5.10)
RDW: 13.7 % (ref 11.0–15.0)
Total Lymphocyte: 36.3 %
WBC: 5 10*3/uL (ref 3.8–10.8)

## 2022-05-24 LAB — URINALYSIS, ROUTINE W REFLEX MICROSCOPIC
Bilirubin Urine: NEGATIVE
Glucose, UA: NEGATIVE
Hgb urine dipstick: NEGATIVE
Ketones, ur: NEGATIVE
Leukocytes,Ua: NEGATIVE
Nitrite: NEGATIVE
Protein, ur: NEGATIVE
Specific Gravity, Urine: 1.018 (ref 1.001–1.035)
pH: 5.5 (ref 5.0–8.0)

## 2022-05-24 LAB — LIPID PANEL
Cholesterol: 211 mg/dL — ABNORMAL HIGH (ref <200)
HDL: 66 mg/dL (ref >=50)
LDL Cholesterol (Calc): 125 mg/dL (calc) — ABNORMAL HIGH
Non-HDL Cholesterol (Calc): 145 mg/dL (calc) — ABNORMAL HIGH (ref <130)
Total CHOL/HDL Ratio: 3.2 (calc) (ref <5.0)
Triglycerides: 98 mg/dL (ref <150)

## 2022-05-24 LAB — VITAMIN D 25 HYDROXY (VIT D DEFICIENCY, FRACTURES): Vit D, 25-Hydroxy: 95 ng/mL (ref 30–100)

## 2022-05-26 ENCOUNTER — Telehealth: Payer: Self-pay | Admitting: Nurse Practitioner

## 2022-05-26 NOTE — Telephone Encounter (Signed)
Patient was in the office for her CPE on July 24 and states that she brought a biometric screening form with her to this appt. Has this been completed and faxed into her insurance company?

## 2022-05-30 ENCOUNTER — Encounter: Payer: Self-pay | Admitting: Nurse Practitioner

## 2022-07-06 ENCOUNTER — Other Ambulatory Visit: Payer: Self-pay | Admitting: Internal Medicine

## 2022-07-06 DIAGNOSIS — Z1231 Encounter for screening mammogram for malignant neoplasm of breast: Secondary | ICD-10-CM

## 2022-07-12 ENCOUNTER — Other Ambulatory Visit: Payer: Self-pay | Admitting: Internal Medicine

## 2022-07-12 DIAGNOSIS — E785 Hyperlipidemia, unspecified: Secondary | ICD-10-CM

## 2022-07-12 DIAGNOSIS — Z0001 Encounter for general adult medical examination with abnormal findings: Secondary | ICD-10-CM

## 2022-08-08 ENCOUNTER — Ambulatory Visit
Admission: RE | Admit: 2022-08-08 | Discharge: 2022-08-08 | Disposition: A | Discharge status: Home / Self Care | Payer: 59 | Source: Ambulatory Visit | Attending: Internal Medicine | Admitting: Internal Medicine

## 2022-08-08 DIAGNOSIS — Z1231 Encounter for screening mammogram for malignant neoplasm of breast: Secondary | ICD-10-CM

## 2022-08-18 ENCOUNTER — Ambulatory Visit (INDEPENDENT_AMBULATORY_CARE_PROVIDER_SITE_OTHER): Payer: 59

## 2022-08-18 VITALS — Temp 96.9°F

## 2022-08-18 DIAGNOSIS — Z23 Encounter for immunization: Secondary | ICD-10-CM | POA: Diagnosis not present

## 2022-09-04 IMAGING — DX DG HUMERUS 2V *R*
3 series · 3 of 3 positions shown · non-contrast
Comparison: None Available.

CLINICAL DATA: Pain after fall

EXAM:
RIGHT HUMERUS - 2+ VIEW

[humerus ap]
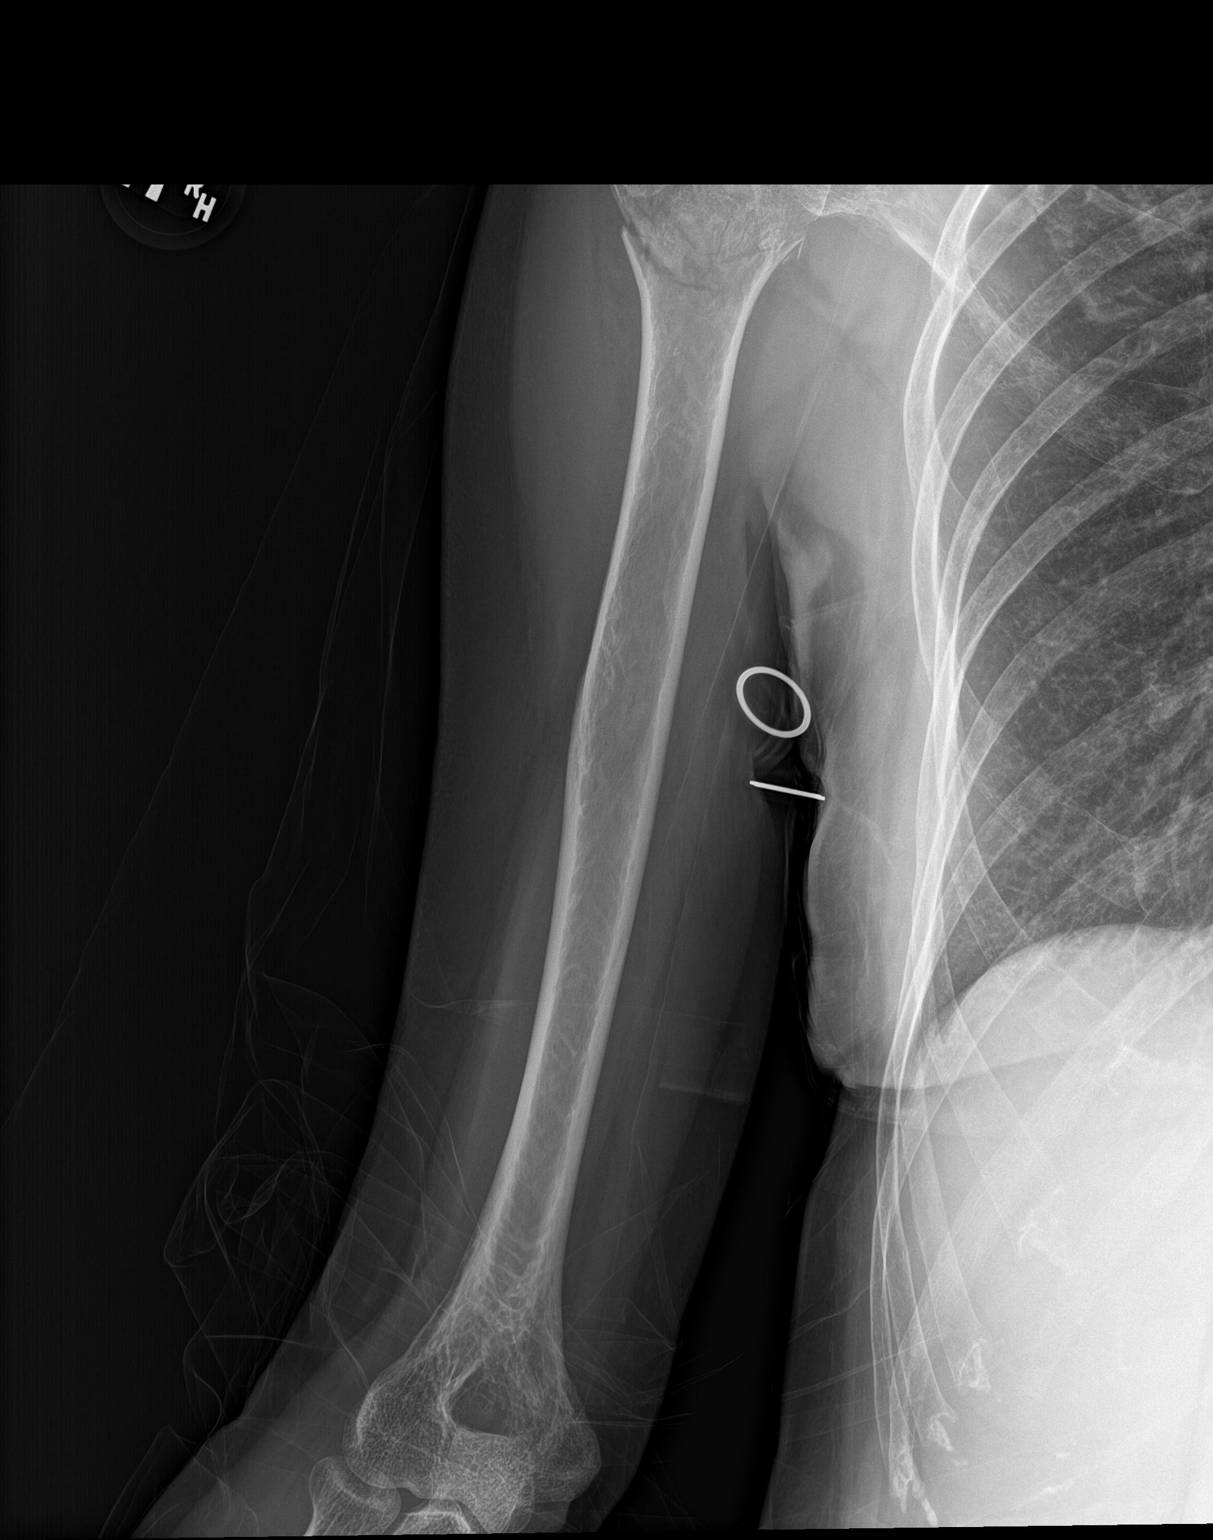

[humerus lat (1 of 2)]
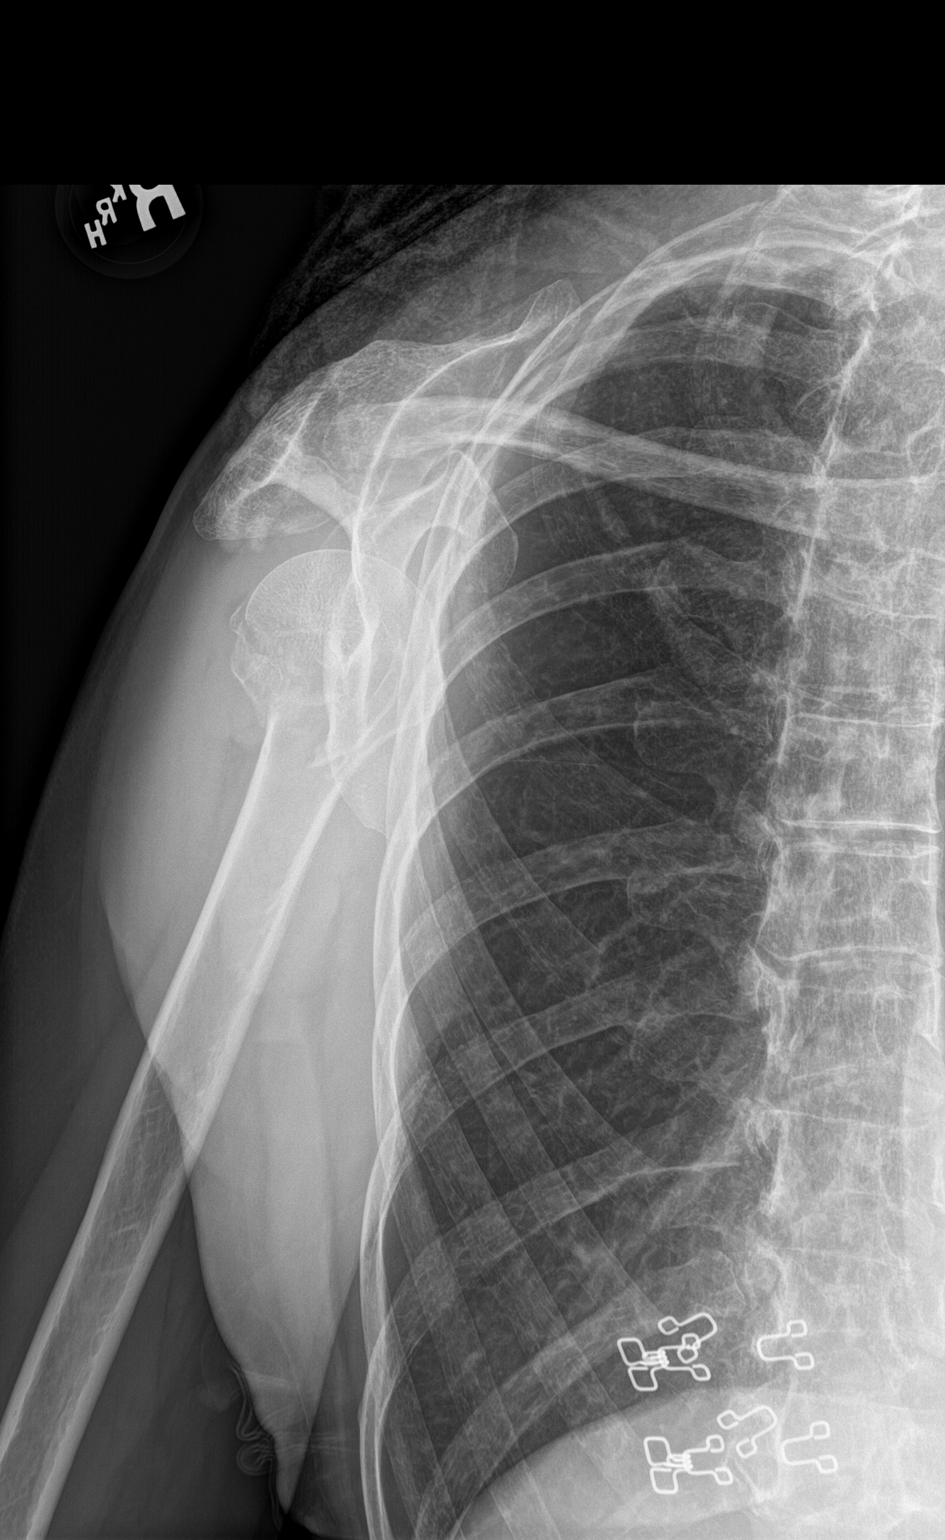

[humerus lat (2 of 2)]
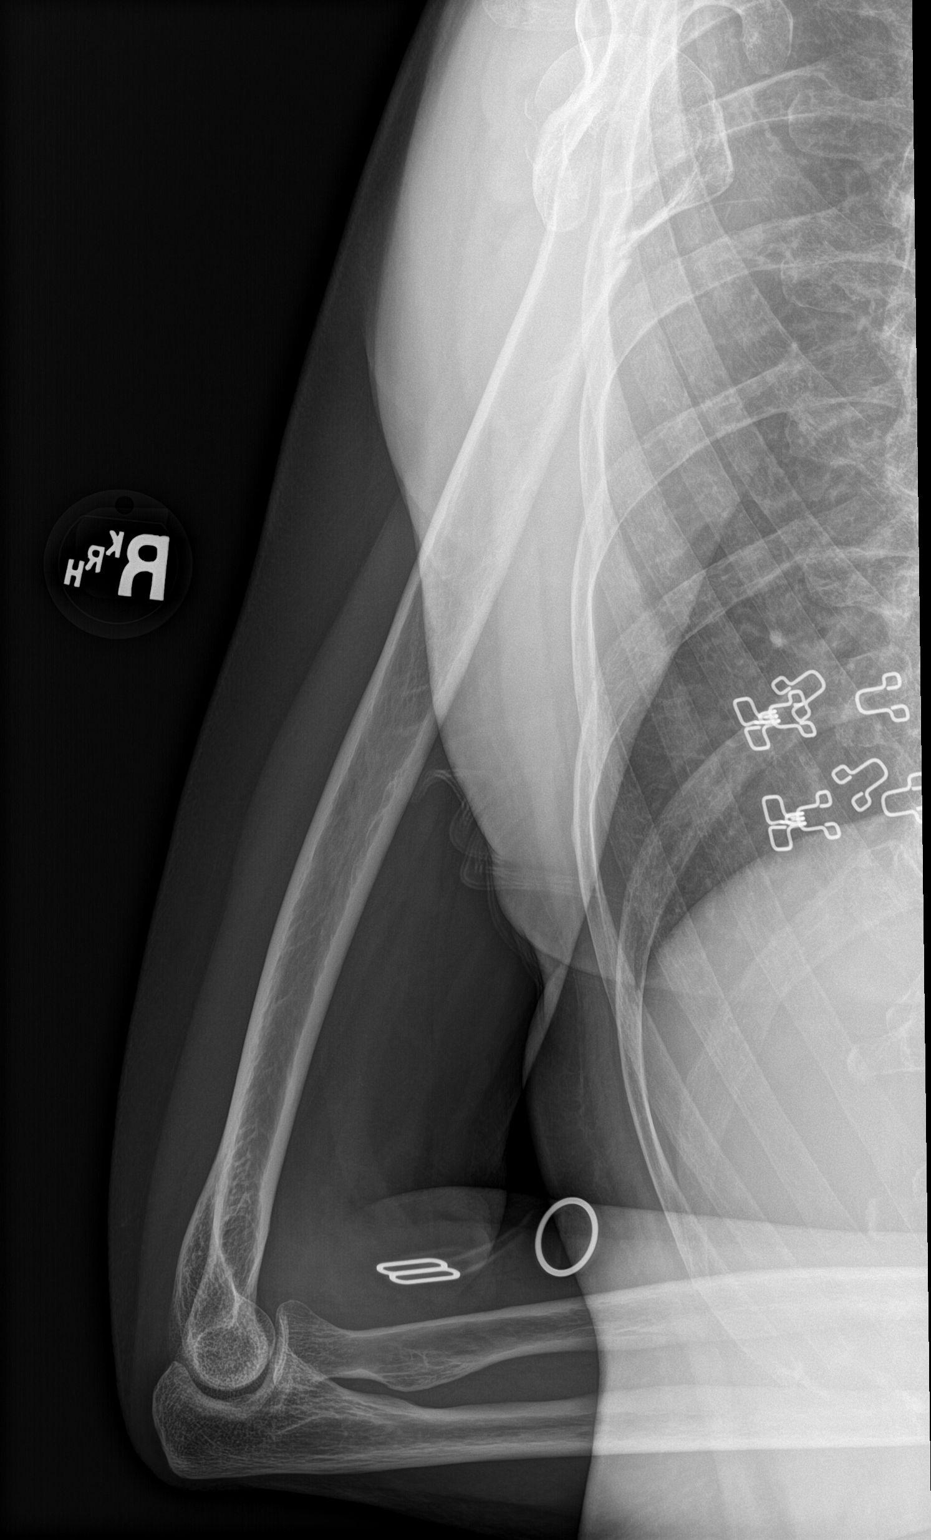

[3 of 3 positions shown; findings below may reference images not displayed]

FINDINGS: There is a comminuted fracture through the right humeral head neck.
The remainder of the humerus is intact. No shoulder dislocation
identified. No other acute abnormalities.
IMPRESSION: Comminuted fracture through the right humeral head neck without
dislocation or significant displacement.

## 2022-09-21 DIAGNOSIS — S42214D Unspecified nondisplaced fracture of surgical neck of right humerus, subsequent encounter for fracture with routine healing: Secondary | ICD-10-CM | POA: Diagnosis not present

## 2022-09-21 DIAGNOSIS — M7501 Adhesive capsulitis of right shoulder: Secondary | ICD-10-CM | POA: Diagnosis not present

## 2022-10-12 ENCOUNTER — Encounter: Payer: Self-pay | Admitting: Internal Medicine

## 2022-10-12 ENCOUNTER — Encounter: Payer: Self-pay | Admitting: Nurse Practitioner

## 2022-10-13 ENCOUNTER — Ambulatory Visit (INDEPENDENT_AMBULATORY_CARE_PROVIDER_SITE_OTHER): Payer: 59 | Admitting: Nurse Practitioner

## 2022-10-13 ENCOUNTER — Other Ambulatory Visit: Payer: Self-pay

## 2022-10-13 ENCOUNTER — Encounter: Payer: Self-pay | Admitting: Nurse Practitioner

## 2022-10-13 VITALS — BP 108/68 | HR 92 | Temp 97.7°F | Ht 63.0 in | Wt 108.0 lb

## 2022-10-13 DIAGNOSIS — G44209 Tension-type headache, unspecified, not intractable: Secondary | ICD-10-CM

## 2022-10-13 DIAGNOSIS — Z1152 Encounter for screening for COVID-19: Secondary | ICD-10-CM

## 2022-10-13 DIAGNOSIS — R051 Acute cough: Secondary | ICD-10-CM | POA: Diagnosis not present

## 2022-10-13 DIAGNOSIS — R0981 Nasal congestion: Secondary | ICD-10-CM | POA: Diagnosis not present

## 2022-10-13 DIAGNOSIS — R6889 Other general symptoms and signs: Secondary | ICD-10-CM

## 2022-10-13 LAB — POC COVID19 BINAXNOW: SARS Coronavirus 2 Ag: NEGATIVE

## 2022-10-13 LAB — POCT INFLUENZA A/B
Influenza A, POC: NEGATIVE
Influenza B, POC: NEGATIVE

## 2022-10-13 MED ORDER — BENZONATATE 100 MG PO CAPS: 100.0000 mg | ORAL_CAPSULE | Freq: Three times a day (TID) | ORAL | 1 refills | Status: DC | PRN

## 2022-10-13 NOTE — Patient Instructions (Addendum)
Adenovirus Infection, Adult Adenoviruses are common viruses that cause many types of infections. These viruses may affect the nose, throat, windpipe, and lungs (respiratory system). They can also affect the eyes, stomach, bowels, bladder, and brain. The most common type of adenovirus infection is the common cold. Most adenovirus infections are not severe. However, they can become severe in people who have another health problem that makes it hard to fight off infection. What are the causes? This condition is caused by an adenovirus entering your body. This can happen when: You touch a surface or object that has the virus on it and then touch your mouth, nose, or eyes with unwashed hands. You come in close physical contact with someone who has this type of infection. This may happen if you hug or shake hands with the person. You breathe in droplets that fly through the air when someone who has the infection talks, coughs, or sneezes. You have contact with stool (feces) that has the virus in it. You use a swimming pool that does not have enough chlorine in it. Chlorine is a chemical that kills germs. Adenoviruses can live outside the body for a long time. They spread easily from person to person (are contagious). What increases the risk? You are more likely to develop this condition if: You spend a lot of time in places where there are many people. These include schools, summer camps, day care centers, community centers, and training centers for people who join the [employer]. You are an older adult. Your body has a weak defense system (immune system). You have a disease of the respiratory system. You have a heart condition. What are the signs or symptoms? Symptoms of this condition can take up to 14 days to appear. They are usually similar to symptoms of the flu. Common symptoms of this condition include: Lung and breathing problems, such as cough, trouble breathing, and stuffy (congested) or runny  nose. Aches and pains, such as headache, stiff neck, sore throat, ear pain, congested ears, or stomachache. Nausea and vomiting. Diarrhea. Fever. Eye problems, such as a red, inflamed eye (pink eye or conjunctivitis). Rash. Less common symptoms include: Being confused or not knowing the time of day, where you are, or who you are (disoriented). Blood in your urine or pain when you urinate. How is this diagnosed? This condition may be diagnosed based on your symptoms and a physical exam. Your health care provider may order tests to make sure that your symptoms are not caused by another problem. Tests can include: Blood tests. Urine tests. Stool tests. Chest X-ray. Tests of tissue or mucus from your throat. How is this treated? This condition goes away on its own with time. Symptoms can be managed by: Getting plenty of rest. Drinking more fluids than usual. Taking over-the-counter medicine to help relieve a sore throat, fever, or headache. Follow these instructions at home:  Activity Rest at home until your symptoms go away. Return to your normal activities as told by your health care provider. Ask your health care provider what activities are safe for you. Lifestyle Do not drink alcohol. Do not use any products that contain nicotine or tobacco. These products include cigarettes, chewing tobacco, and vaping devices, such as e-cigarettes. If you need help quitting, ask your health care provider. General instructions Drink enough fluid to keep your urine pale yellow. Take over-the-counter and prescription medicines only as told by your health care provider. If you have a sore throat, gargle with a mixture of salt  and water 3-4 times a day or as needed. To make salt water, completely dissolve -1 tsp (3-6 g) of salt in 1 cup (237 mL) of warm water. How is this prevented?     Adenoviruses often are not killed by common cleaning products. They also can stay on surfaces for a long time.  To help prevent infection: Wash your hands often with soap and water for at least 20 seconds. If soap and water are not available, use hand sanitizer. Cover your mouth when you cough. Cover your nose and mouth when you sneeze. Do not touch your eyes, nose, or mouth with unwashed hands. Wash your hands after touching your face. Clean commonly used objects often. Do not use a swimming pool that does not have enough chlorine in it. Avoid close contact with people who are sick. Do not go to school or work when you are sick. Do not share cups or eating utensils. Where to find more information Centers for Disease Control and Prevention: http://www.wolf.info/ Contact a health care provider if: Your symptoms stay the same after 10 days. Your symptoms get worse. You cannot eat or drink without vomiting. Get help right away if: You have trouble breathing or you are breathing fast. Your skin, lips, or fingernails look blue. You have fast or irregular heartbeats (palpitations). You become confused. You lose consciousness. These symptoms may be an emergency. Get help right away. Call 911. Do not wait to see if the symptoms will go away. Do not drive yourself to the hospital. Summary The most common type of adenovirus infection is the common cold. Adenoviruses can live outside the body for a long time. They spread easily from person to person (are contagious). This condition goes away on its own with time. Rest at home until your symptoms go away. Contact a health care provider if your symptoms stay the same after 10 days or get worse. This information is not intended to replace advice given to you by your health care provider. Make sure you discuss any questions you have with your health care provider. Document Revised: 01/17/2022 Document Reviewed: 01/17/2022 Elsevier Patient Education  [place].   Antibiotic Resistance Antibiotics are medicines used to treat infections that are caused by  bacteria. Antibiotic resistance means that the medicine once used to treat an infection no longer works against the bacteria. Resistance can develop when antibiotics are given in response to bacterial illnesses that do not need antibiotics, or when antibiotics are incorrectly given for viral illnesses, like colds or the flu (influenza). It can also happen when a person uses antibiotics in the wrong way and some of the infectious bacteria develop resistance to the antibiotic. These bacteria may grow and cause infections that are resistant to various types of antibiotics. If this happens, the resistant bacteria can spread infections that are difficult to treat. What are the causes? Antibiotic resistance happens when bacteria come into contact with an antibiotic over and over again. Over time, the bacteria become resistant to the antibiotic. What increases the risk? This condition is more likely to develop in people who: Are repeatedly given antibiotics to treat viral infections. Do not take their antibiotic medicine exactly as prescribed, such as not finishing all of the medicine. Need to take antibiotics often because of a long-term medical condition. Take medicines that weaken their body's defense system (immune system). Have surgery or need a procedure that replaces some of the work that healthy kidneys do (dialysis). Are older adults. Additional risk factors  include: Having a type of infection that is more likely to be caused by resistant bacteria. These include certain: Skin infections. STIs (sexually transmitted infections). Respiratory infections. Infections of the lining of the brain and spinal cord (meningitis). Gastrointestinal infections. Eating foods from animals that were treated with antibiotics. Antibiotic-resistant bacteria can be passed through the food. Having an organ transplant or receiving treatment for cancer. Living with or caring for someone with an antibiotic-resistant  infection. Being hospitalized for a long time or living in a long-term care facility. What are the signs or symptoms? The main symptom of this condition is having an infection that does not improve with normal treatment. The specific signs and symptoms that you have will depend on the type of infection, but they may include: A fever. Warmth, redness, and tenderness around a wound or incision. Brown, yellow, or green drainage from a wound or incision. A bad smell coming from a wound or incision. Nausea, vomiting, and abdominal pain. How is this diagnosed? This condition may be diagnosed based on your symptoms and medical history. Your health care provider may suspect antibiotic resistance if your condition does not improve after you have been treated for an infection. You may also have other tests, including: Analysis of a fluid or stool (feces) sample. This is done to identify bacteria under a microscope and determine what type of antibiotic will work against them (culture and sensitivity). Other blood tests and imaging tests. These are done to check if your infection has spread or become more serious. How is this treated? Treatment for this condition depends on the nature of the specific infection. Treatment may include oral antibiotics that kill more types of bacteria (broad spectrum). Serious antibiotic-resistant infections may need to be treated in the hospital. In severe cases, this may include: Surgery to remove infected or damaged tissue. Antibiotics or other medicines given through an IV. Follow these instructions at home: Taking antibiotics correctly  Understand when antibiotics are needed and when they are not needed. Do not ask for an antibiotic prescription if you have been diagnosed with a viral illness. Antibiotic medicine will not make your illness go away faster. Common viral illnesses include an ear infection, a sinus infection, the stomach flu, or bronchitis. Do not take  antibiotics that are left over from a previous prescription. Do not take antibiotics that were prescribed for someone else. If you are prescribed an antibiotic: Take it exactly as told by your health care provider. Do not stop taking the medicine even if you start to feel better. If you have been taking it for more than 10 days, ask your health care provider or pharmacist if you should keep taking it. Do not save unused antibiotics to use at a later date. Get rid of unused medicine as told by your health care provider or pharmacist. Preventing infection     If you have an infection, avoid close contact with people around you. Avoid using personal items that are used by other people, such as towels, razors, or bedding. Regularly disinfect doorknobs, food preparation surfaces, and bathrooms with bleach-containing products or solutions. Wash your hands often with soap and water for at least 20 seconds. In particular, wash your hands: After using the bathroom. Before and after caring for a wound or incision. Before and after preparing food. Stay up to date on vaccinations. Many vaccines prevent illnesses that are treated with antibiotics. Contact a health care provider if you: Have a fever or chills. Are taking a new antibiotic  and you are not getting better after a few days. Have three or more periods of diarrhea after starting a new antibiotic. Have increased warmth, redness, or tenderness around a wound or incision. Have brown, yellow, or green drainage from a wound or incision. Have a bad-smelling wound or incision. Have new or worsening nausea, vomiting, or abdominal pain. Get help right away if: You develop a rash. Your mouth or tongue itches. You have a tight feeling in your throat. You have trouble breathing. You have chest pain or tightness. You are dizzy or you faint. These symptoms may represent a serious problem that is an emergency. Do not wait to see if the symptoms will go  away. Get medical help right away. Call your local emergency services (911 in the [place] Do not drive yourself to the hospital. Summary Antibiotics are medicines used to treat infections that are caused by bacteria. Antibiotic resistance means that the medicine once used to treat an infection no longer works against the bacteria. These bacteria may grow and cause infections that are resistant to various types of antibiotics. If this happens, the resistant bacteria can spread infection that is difficult to treat. Understand when antibiotics are needed and when they are not needed. If you are prescribed an antibiotic, take it exactly as told by your health care provider. Do not stop taking the medicine even if you start to feel better. Get help right away if you develop a rash. This information is not intended to replace advice given to you by your health care provider. Make sure you discuss any questions you have with your health care provider. Document Revised: 08/30/2019 Document Reviewed: 08/30/2019 Elsevier Patient Education  [place].

## 2022-10-13 NOTE — Progress Notes (Signed)
Assessment and Plan:  Heidi Stone was seen today for a an episodic visit.  Diagnoses and all order for this visit:  1. Encounter for screening for COVID-19 Negative  - POC COVID-19  2. Flu-like symptoms Negative  - POCT Influenza A/B  3. Acute cough Stay well hydrated Start Tessalon as directed.  - benzonatate (TESSALON PERLES) 100 MG capsule; Take 1 capsule (100 mg total) by mouth 3 (three) times daily as needed for cough.  Dispense: 30 capsule; Refill: 1  4. Nasal congestion Mucinex  samples provided Suggested antihistamine to help decrease mucus production. Zyrtec samples provided.   Stay well hydrated.  5. Acute non intractable tension-type headache Tylenol as needed as directed  Notify office for further evaluation and treatment, questions or concerns if s/s fail to improve. The risks and benefits of my recommendations, as well as other treatment options were discussed with the patient today. Questions were answered.  Further disposition pending results of labs. Discussed med's effects and SE's.    Over 15 minutes of exam, counseling, chart review, and critical decision making was performed.   Future Appointments  Date Time Provider [place]  05/24/2023  2:00 PM Ngozi Alvidrez, Kenney Houseman, NP [facility] None    ------------------------------------------------------------------------------------------------------------------   HPI BP 108/68   Pulse 92   Temp 97.7 F (36.5 C)   Ht '5\' 3"'$  (1.6 m)   Wt 108 lb (49 kg)   SpO2 99%   BMI 19.13 kg/m    Patient complains of symptoms of a URI, possible sinusitis. Symptoms include achiness, headache described as throbbing, nasal congestion, and sore throat. Onset of symptoms was 2 days ago, and has been unchanged since that time. Treatment to date: cough suppressants and decongestants.  Denies fever, chills, N/V.  Past Medical History:  Diagnosis Date   Allergy    Arthritis    Hyperlipidemia    Migraines       No Known Allergies  Current Outpatient Medications on File Prior to Visit  Medication Sig   Bepotastine Besilate 1.5 % SOLN Place 1 drop into both eyes 2 (two) times daily as needed.   ezetimibe (ZETIA) 10 MG tablet TAKE 1 TABLET DAILY FOR CHOLESTEROL / PATIENT KNOWS TO TAKE BY MOUTH   fluticasone (FLONASE) 50 MCG/ACT nasal spray PLACE 1 SPRAY INTO BOTH NOSTRILS DAILY.   ibuprofen (ADVIL) 800 MG tablet TAKE 1 TABLET THREE TIMES A DAY WITH FOOD AS NEEDED   NURTEC 75 MG TBDP TAKE 1 TAB ONCE PER DAY AS NEEDED WITH ONSET OF MIGRAINE HEADACHE.   rizatriptan (MAXALT-MLT) 10 MG disintegrating tablet DISSOLVE 1 TABLET UNDER TONGUE AS NEEDED FOR MIGRAINE   No current facility-administered medications on file prior to visit.    ROS: all negative except what is noted in the HPI.   Physical Exam:  BP 108/68   Pulse 92   Temp 97.7 F (36.5 C)   Ht '5\' 3"'$  (1.6 m)   Wt 108 lb (49 kg)   SpO2 99%   BMI 19.13 kg/m   General Appearance: NAD.  Awake, conversant and cooperative. Eyes: PERRLA, EOMs intact.  Sclera white.  Conjunctiva without erythema. Sinuses: No frontal/maxillary tenderness.  No nasal discharge. Nares patent.  ENT/Mouth: Ext aud canals clear.  Bilateral TMs w/DOL and without erythema or bulging. Hearing intact.  Posterior pharynx without swelling or exudate.  Tonsils without swelling or erythema.  Neck: Supple.  No masses, nodules or thyromegaly. Respiratory: Effort is regular with non-labored breathing. Breath sounds are equal bilaterally without  rales, rhonchi, wheezing or stridor.  Cardio: RRR with no MRGs. Brisk peripheral pulses without edema.  Abdomen: Active BS in all four quadrants.  Soft and non-tender without guarding, rebound tenderness, hernias or masses. Lymphatics: Non tender without lymphadenopathy.  Musculoskeletal: Full ROM, 5/5 strength, normal ambulation.  No clubbing or cyanosis. Skin: Appropriate color for ethnicity. Warm without rashes, lesions, ecchymosis,  ulcers.  Neuro: CN II-XII grossly normal. Normal muscle tone without cerebellar symptoms and intact sensation.   Psych: AO X 3,  appropriate mood and affect, insight and judgment.     Darrol Jump, NP 10:26 AM [facility]

## 2022-10-19 MED ORDER — AZITHROMYCIN 500 MG PO TABS: 500.0000 mg | ORAL_TABLET | Freq: Every day | ORAL | 0 refills | Status: AC

## 2022-11-23 DIAGNOSIS — M7501 Adhesive capsulitis of right shoulder: Secondary | ICD-10-CM | POA: Diagnosis not present

## 2022-11-23 DIAGNOSIS — S42214D Unspecified nondisplaced fracture of surgical neck of right humerus, subsequent encounter for fracture with routine healing: Secondary | ICD-10-CM | POA: Diagnosis not present

## 2022-12-08 DIAGNOSIS — M25511 Pain in right shoulder: Secondary | ICD-10-CM | POA: Diagnosis not present

## 2022-12-21 DIAGNOSIS — S42214D Unspecified nondisplaced fracture of surgical neck of right humerus, subsequent encounter for fracture with routine healing: Secondary | ICD-10-CM | POA: Diagnosis not present

## 2022-12-21 DIAGNOSIS — M7501 Adhesive capsulitis of right shoulder: Secondary | ICD-10-CM | POA: Diagnosis not present

## 2023-01-24 DIAGNOSIS — M24111 Other articular cartilage disorders, right shoulder: Secondary | ICD-10-CM | POA: Diagnosis not present

## 2023-01-24 DIAGNOSIS — M7551 Bursitis of right shoulder: Secondary | ICD-10-CM | POA: Diagnosis not present

## 2023-01-24 DIAGNOSIS — G8918 Other acute postprocedural pain: Secondary | ICD-10-CM | POA: Diagnosis not present

## 2023-01-24 DIAGNOSIS — S43431A Superior glenoid labrum lesion of right shoulder, initial encounter: Secondary | ICD-10-CM | POA: Diagnosis not present

## 2023-01-24 DIAGNOSIS — M7501 Adhesive capsulitis of right shoulder: Secondary | ICD-10-CM | POA: Diagnosis not present

## 2023-01-24 DIAGNOSIS — S43491A Other sprain of right shoulder joint, initial encounter: Secondary | ICD-10-CM | POA: Diagnosis not present

## 2023-01-24 DIAGNOSIS — M659 Synovitis and tenosynovitis, unspecified: Secondary | ICD-10-CM | POA: Diagnosis not present

## 2023-01-25 DIAGNOSIS — M25511 Pain in right shoulder: Secondary | ICD-10-CM | POA: Diagnosis not present

## 2023-01-25 DIAGNOSIS — M25611 Stiffness of right shoulder, not elsewhere classified: Secondary | ICD-10-CM | POA: Diagnosis not present

## 2023-01-27 DIAGNOSIS — M25511 Pain in right shoulder: Secondary | ICD-10-CM | POA: Diagnosis not present

## 2023-01-27 DIAGNOSIS — M25611 Stiffness of right shoulder, not elsewhere classified: Secondary | ICD-10-CM | POA: Diagnosis not present

## 2023-01-30 DIAGNOSIS — M25611 Stiffness of right shoulder, not elsewhere classified: Secondary | ICD-10-CM | POA: Diagnosis not present

## 2023-01-30 DIAGNOSIS — M25511 Pain in right shoulder: Secondary | ICD-10-CM | POA: Diagnosis not present

## 2023-02-01 DIAGNOSIS — M25611 Stiffness of right shoulder, not elsewhere classified: Secondary | ICD-10-CM | POA: Diagnosis not present

## 2023-02-01 DIAGNOSIS — M25511 Pain in right shoulder: Secondary | ICD-10-CM | POA: Diagnosis not present

## 2023-02-03 DIAGNOSIS — M25611 Stiffness of right shoulder, not elsewhere classified: Secondary | ICD-10-CM | POA: Diagnosis not present

## 2023-02-03 DIAGNOSIS — M25511 Pain in right shoulder: Secondary | ICD-10-CM | POA: Diagnosis not present

## 2023-02-06 DIAGNOSIS — M25611 Stiffness of right shoulder, not elsewhere classified: Secondary | ICD-10-CM | POA: Diagnosis not present

## 2023-02-06 DIAGNOSIS — M25511 Pain in right shoulder: Secondary | ICD-10-CM | POA: Diagnosis not present

## 2023-02-08 DIAGNOSIS — M25611 Stiffness of right shoulder, not elsewhere classified: Secondary | ICD-10-CM | POA: Diagnosis not present

## 2023-02-08 DIAGNOSIS — M25511 Pain in right shoulder: Secondary | ICD-10-CM | POA: Diagnosis not present

## 2023-02-10 DIAGNOSIS — M25611 Stiffness of right shoulder, not elsewhere classified: Secondary | ICD-10-CM | POA: Diagnosis not present

## 2023-02-10 DIAGNOSIS — M25511 Pain in right shoulder: Secondary | ICD-10-CM | POA: Diagnosis not present

## 2023-02-15 DIAGNOSIS — M25511 Pain in right shoulder: Secondary | ICD-10-CM | POA: Diagnosis not present

## 2023-02-15 DIAGNOSIS — M25611 Stiffness of right shoulder, not elsewhere classified: Secondary | ICD-10-CM | POA: Diagnosis not present

## 2023-02-17 DIAGNOSIS — M25511 Pain in right shoulder: Secondary | ICD-10-CM | POA: Diagnosis not present

## 2023-03-09 DIAGNOSIS — M7501 Adhesive capsulitis of right shoulder: Secondary | ICD-10-CM | POA: Diagnosis not present

## 2023-03-10 DIAGNOSIS — M25611 Stiffness of right shoulder, not elsewhere classified: Secondary | ICD-10-CM | POA: Diagnosis not present

## 2023-03-10 DIAGNOSIS — M25511 Pain in right shoulder: Secondary | ICD-10-CM | POA: Diagnosis not present

## 2023-03-15 DIAGNOSIS — M25511 Pain in right shoulder: Secondary | ICD-10-CM | POA: Diagnosis not present

## 2023-03-15 DIAGNOSIS — M25611 Stiffness of right shoulder, not elsewhere classified: Secondary | ICD-10-CM | POA: Diagnosis not present

## 2023-03-17 DIAGNOSIS — M25611 Stiffness of right shoulder, not elsewhere classified: Secondary | ICD-10-CM | POA: Diagnosis not present

## 2023-03-17 DIAGNOSIS — M25511 Pain in right shoulder: Secondary | ICD-10-CM | POA: Diagnosis not present

## 2023-03-21 DIAGNOSIS — M25611 Stiffness of right shoulder, not elsewhere classified: Secondary | ICD-10-CM | POA: Diagnosis not present

## 2023-03-21 DIAGNOSIS — M25511 Pain in right shoulder: Secondary | ICD-10-CM | POA: Diagnosis not present

## 2023-03-23 DIAGNOSIS — M25511 Pain in right shoulder: Secondary | ICD-10-CM | POA: Diagnosis not present

## 2023-03-28 DIAGNOSIS — M25511 Pain in right shoulder: Secondary | ICD-10-CM | POA: Diagnosis not present

## 2023-03-28 DIAGNOSIS — M25611 Stiffness of right shoulder, not elsewhere classified: Secondary | ICD-10-CM | POA: Diagnosis not present

## 2023-04-05 DIAGNOSIS — M25511 Pain in right shoulder: Secondary | ICD-10-CM | POA: Diagnosis not present

## 2023-04-05 DIAGNOSIS — M25611 Stiffness of right shoulder, not elsewhere classified: Secondary | ICD-10-CM | POA: Diagnosis not present

## 2023-05-24 ENCOUNTER — Encounter: Payer: 59 | Admitting: Nurse Practitioner

## 2023-06-05 ENCOUNTER — Encounter: Payer: Self-pay | Admitting: Nurse Practitioner

## 2023-06-05 ENCOUNTER — Other Ambulatory Visit: Payer: Self-pay | Admitting: Nurse Practitioner

## 2023-06-05 ENCOUNTER — Ambulatory Visit (INDEPENDENT_AMBULATORY_CARE_PROVIDER_SITE_OTHER): Payer: 59 | Admitting: Nurse Practitioner

## 2023-06-05 VITALS — BP 110/72 | HR 86 | Temp 98.2°F | Ht 63.25 in | Wt 112.4 lb

## 2023-06-05 DIAGNOSIS — Z1329 Encounter for screening for other suspected endocrine disorder: Secondary | ICD-10-CM | POA: Diagnosis not present

## 2023-06-05 DIAGNOSIS — E559 Vitamin D deficiency, unspecified: Secondary | ICD-10-CM

## 2023-06-05 DIAGNOSIS — Z681 Body mass index (BMI) 19 or less, adult: Secondary | ICD-10-CM | POA: Diagnosis not present

## 2023-06-05 DIAGNOSIS — S42294S Other nondisplaced fracture of upper end of right humerus, sequela: Secondary | ICD-10-CM | POA: Diagnosis not present

## 2023-06-05 DIAGNOSIS — E782 Mixed hyperlipidemia: Secondary | ICD-10-CM

## 2023-06-05 DIAGNOSIS — G43009 Migraine without aura, not intractable, without status migrainosus: Secondary | ICD-10-CM

## 2023-06-05 DIAGNOSIS — Z0001 Encounter for general adult medical examination with abnormal findings: Secondary | ICD-10-CM | POA: Diagnosis not present

## 2023-06-05 DIAGNOSIS — Z0000 Encounter for general adult medical examination without abnormal findings: Secondary | ICD-10-CM | POA: Diagnosis not present

## 2023-06-05 DIAGNOSIS — J302 Other seasonal allergic rhinitis: Secondary | ICD-10-CM

## 2023-06-05 DIAGNOSIS — Z79899 Other long term (current) drug therapy: Secondary | ICD-10-CM | POA: Diagnosis not present

## 2023-06-05 DIAGNOSIS — Z136 Encounter for screening for cardiovascular disorders: Secondary | ICD-10-CM | POA: Diagnosis not present

## 2023-06-05 DIAGNOSIS — M85841 Other specified disorders of bone density and structure, right hand: Secondary | ICD-10-CM

## 2023-06-05 DIAGNOSIS — Z1389 Encounter for screening for other disorder: Secondary | ICD-10-CM

## 2023-06-05 DIAGNOSIS — I10 Essential (primary) hypertension: Secondary | ICD-10-CM | POA: Diagnosis not present

## 2023-06-05 DIAGNOSIS — R7309 Other abnormal glucose: Secondary | ICD-10-CM | POA: Diagnosis not present

## 2023-06-05 DIAGNOSIS — M1990 Unspecified osteoarthritis, unspecified site: Secondary | ICD-10-CM

## 2023-06-05 DIAGNOSIS — M7501 Adhesive capsulitis of right shoulder: Secondary | ICD-10-CM

## 2023-06-05 DIAGNOSIS — H938X3 Other specified disorders of ear, bilateral: Secondary | ICD-10-CM

## 2023-06-05 LAB — CBC WITH DIFFERENTIAL/PLATELET
Absolute Monocytes: 702 cells/uL (ref 200–950)
Basophils Absolute: 39 cells/uL (ref 0–200)
Basophils Relative: 0.6 %
Eosinophils Absolute: 78 cells/uL (ref 15–500)
Eosinophils Relative: 1.2 %
HCT: 38.6 % (ref 35.0–45.0)
Hemoglobin: 13.2 g/dL (ref 11.7–15.5)
Lymphs Abs: 1268 cells/uL (ref 850–3900)
MCH: 30.4 pg (ref 27.0–33.0)
MCHC: 34.2 g/dL (ref 32.0–36.0)
MCV: 88.9 fL (ref 80.0–100.0)
MPV: 10.7 fL (ref 7.5–12.5)
Monocytes Relative: 10.8 %
Neutro Abs: 4414 cells/uL (ref 1500–7800)
Neutrophils Relative %: 67.9 %
Platelets: 243 10*3/uL (ref 140–400)
RBC: 4.34 10*6/uL (ref 3.80–5.10)
RDW: 12.4 % (ref 11.0–15.0)
Total Lymphocyte: 19.5 %
WBC: 6.5 10*3/uL (ref 3.8–10.8)

## 2023-06-05 MED ORDER — NURTEC 75 MG PO TBDP
ORAL_TABLET | ORAL | 5 refills | Status: DC
Start: 2023-06-05 — End: 2023-06-05

## 2023-06-05 NOTE — Patient Instructions (Addendum)
Suggested ear wash recipe: 1 teaspoon of white vinegar. 1 teaspoon of rubbing alcohol. Mix the ingredients together in a cup. Use an eyedropper to put most of the mixture in the ear. Tilt the head to be sure the earwash runs all the way down inside the ear. Then tilt the head the other way and let the liquid drain out. Continue to monitor.   Healthy Eating, Adult Healthy eating may help you get and keep a healthy body weight, reduce the risk of chronic disease, and live a long and productive life. It is important to follow a healthy eating pattern. Your nutritional and calorie needs should be met mainly by different nutrient-rich foods. What are tips for following this plan? Reading food labels Read labels and choose the following: Reduced or low sodium products. Juices with 100% fruit juice. Foods with low saturated fats (<3 g per serving) and high polyunsaturated and monounsaturated fats. Foods with whole grains, such as whole wheat, cracked wheat, brown rice, and wild rice. Whole grains that are fortified with folic acid. This is recommended for females who are pregnant or who want to become pregnant. Read labels and do not eat or drink the following: Foods or drinks with added sugars. These include foods that contain brown sugar, corn sweetener, corn syrup, dextrose, fructose, glucose, high-fructose corn syrup, honey, invert sugar, lactose, malt syrup, maltose, molasses, raw sugar, sucrose, trehalose, or turbinado sugar. Limit your intake of added sugars to less than 10% of your total daily calories. Do not eat more than the following amounts of added sugar per day: 6 teaspoons (25 g) for females. 9 teaspoons (38 g) for males. Foods that contain processed or refined starches and grains. Refined grain products, such as white flour, degermed cornmeal, white bread, and white rice. Shopping Choose nutrient-rich snacks, such as vegetables, whole fruits, and nuts. Avoid high-calorie and  high-sugar snacks, such as potato chips, fruit snacks, and candy. Use oil-based dressings and spreads on foods instead of solid fats such as butter, margarine, sour cream, or cream cheese. Limit pre-made sauces, mixes, and "instant" products such as flavored rice, instant noodles, and ready-made pasta. Try more plant-protein sources, such as tofu, tempeh, black beans, edamame, lentils, nuts, and seeds. Explore eating plans such as the Mediterranean diet or vegetarian diet. Try heart-healthy dips made with beans and healthy fats like hummus and guacamole. Vegetables go great with these. Cooking Use oil to saut or stir-fry foods instead of solid fats such as butter, margarine, or lard. Try baking, boiling, grilling, or broiling instead of frying. Remove the fatty part of meats before cooking. Steam vegetables in water or broth. Meal planning  At meals, imagine dividing your plate into fourths: One-half of your plate is fruits and vegetables. One-fourth of your plate is whole grains. One-fourth of your plate is protein, especially lean meats, poultry, eggs, tofu, beans, or nuts. Include low-fat dairy as part of your daily diet. Lifestyle Choose healthy options in all settings, including home, work, school, restaurants, or stores. Prepare your food safely: Wash your hands after handling raw meats. Where you prepare food, keep surfaces clean by regularly washing with hot, soapy water. Keep raw meats separate from ready-to-eat foods, such as fruits and vegetables. Cook seafood, meat, poultry, and eggs to the recommended temperature. Get a food thermometer. Store foods at safe temperatures. In general: Keep cold foods at 61F (4.4C) or below. Keep hot foods at 161F (60C) or above. Keep your freezer at [facility] (-17.8C) or below. Foods are  not safe to eat if they have been between the temperatures of 40-140F (4.4-60C) for more than 2 hours. What foods should I eat? Fruits Aim to eat 1-2  cups of fresh, canned (in natural juice), or frozen fruits each day. One cup of fruit equals 1 small apple, 1 large banana, 8 large strawberries, 1 cup (237 g) canned fruit,  cup (82 g) dried fruit, or 1 cup (240 mL) 100% juice. Vegetables Aim to eat 2-4 cups of fresh and frozen vegetables each day, including different varieties and colors. One cup of vegetables equals 1 cup (91 g) broccoli or cauliflower florets, 2 medium carrots, 2 cups (150 g) raw, leafy greens, 1 large tomato, 1 large bell pepper, 1 large sweet potato, or 1 medium white potato. Grains Aim to eat 5-10 ounce-equivalents of whole grains each day. Examples of 1 ounce-equivalent of grains include 1 slice of bread, 1 cup (40 g) ready-to-eat cereal, 3 cups (24 g) popcorn, or  cup (93 g) cooked rice. Meats and other proteins Try to eat 5-7 ounce-equivalents of protein each day. Examples of 1 ounce-equivalent of protein include 1 egg,  oz nuts (12 almonds, 24 pistachios, or 7 walnut halves), 1/4 cup (90 g) cooked beans, 6 tablespoons (90 g) hummus or 1 tablespoon (16 g) peanut butter. A cut of meat or fish that is the size of a deck of cards is about 3-4 ounce-equivalents (85 g). Of the protein you eat each week, try to have at least 8 sounce (227 g) of seafood. This is about 2 servings per week. This includes salmon, trout, herring, sardines, and anchovies. Dairy Aim to eat 3 cup-equivalents of fat-free or low-fat dairy each day. Examples of 1 cup-equivalent of dairy include 1 cup (240 mL) milk, 8 ounces (250 g) yogurt, 1 ounces (44 g) natural cheese, or 1 cup (240 mL) fortified soy milk. Fats and oils Aim for about 5 teaspoons (21 g) of fats and oils per day. Choose monounsaturated fats, such as canola and olive oils, mayonnaise made with olive oil or avocado oil, avocados, peanut butter, and most nuts, or polyunsaturated fats, such as sunflower, corn, and soybean oils, walnuts, pine nuts, sesame seeds, sunflower seeds, and  flaxseed. Beverages Aim for 6 eight-ounce glasses of water per day. Limit coffee to 3-5 eight-ounce cups per day. Limit caffeinated beverages that have added calories, such as soda and energy drinks. If you drink alcohol: Limit how much you have to: 0-1 drink a day if you are female. 0-2 drinks a day if you are female. Know how much alcohol is in your drink. In the [place] one drink is one 12 oz bottle of beer (355 mL), one 5 oz glass of wine (148 mL), or one 1 oz glass of hard liquor (44 mL). Seasoning and other foods Try not to add too much salt to your food. Try using herbs and spices instead of salt. Try not to add sugar to food. This information is based on [place] nutrition guidelines. To learn more, visit DisposableNylon.be. Exact amounts may vary. You may need different amounts. This information is not intended to replace advice given to you by your health care provider. Make sure you discuss any questions you have with your health care provider. Document Revised: 07/18/2022 Document Reviewed: 07/18/2022 Elsevier Patient Education  2024 [employer].

## 2023-06-05 NOTE — Progress Notes (Signed)
Complete Physical  Assessment and Plan:  Encounter for general adult medical examination with abnormal findings Due annually  Health Maintenance reviewed Healthy lifestyle reviewed and goals set  Migraine without aura and without status migrainosus, not intractable Managed with Nurtec Failed Triptan/Maxalt/Botox- not effective Avoid triggers, continue medications Neurology PRN  Arthritis Currently following with Orthopedics Managed with Tylenol and IBU  Allergic state, subsequent encounter Suggested OTC Zyrtec. Sample Provided. Referral to ENT if symptoms persists.  Hyperlipidemia, unspecified hyperlipidemia type Continue Zetia Discussed adding low dose statin if cholesterol remains elevated. Discussed lifestyle modifications. Recommended diet heavy in fruits and veggies, omega 3's. Decrease consumption of animal meats, cheeses, and dairy products. Remain active and exercise as tolerated. Continue to monitor.  Other abnormal glucose Education: Reviewed 'ABCs' of diabetes management  Discussed goals to be met and/or maintained include A1C (<7) Blood pressure (<130/80) Cholesterol (LDL <70) Continue Eye Exam yearly  Continue Dental Exam Q6 mo Discussed dietary recommendations Discussed Physical Activity recommendations Check A1C  Vitamin D deficiency Continue supplement. Check Vitamin D levels  BMI 19 Discussed appropriate BMI Diet modification. Physical activity. Encouraged/praised to build confidence.  Medication management All medications discussed and reviewed in full. All questions and concerns regarding medications addressed.    Screening for proteinuria or hematuria. 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 Advised on fall prevention measures including proper lighting in all rooms, removal of area rugs and floor clutter, use of walking devices as deemed appropriate, avoidance  of uneven walking surfaces. Smoking cessation and moderate alcohol consumption if applicable Consume 800 to 1000 IU of vitamin D daily with a goal vitamin D serum value of 30 ng/mL or higher. Aim for 1000 to 1200 mg of elemental calcium daily through supplements and/or dietary sources.  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 Wear ear plugs or swimmers cap in pool Continue to monitor  Orders Placed This Encounter  Procedures   DG Bone Density    Standing Status:   Future    Standing Expiration Date:   06/04/2024    Order Specific Question:   Reason for Exam (SYMPTOM  OR DIAGNOSIS REQUIRED)    Answer:   Osteopenia    Order Specific Question:   Preferred imaging location?    Answer:   [facility]   CBC with Differential/Platelet   COMPLETE METABOLIC PANEL WITH GFR   Magnesium   Lipid panel   TSH   Hemoglobin A1c   Insulin, random   VITAMIN D 25 Hydroxy (Vit-D Deficiency, Fractures)   Urinalysis, Routine w reflex microscopic   Microalbumin / creatinine urine ratio   EKG 12-Lead     Notify office for further evaluation and treatment, questions or concerns if any reported s/s fail to improve.   The patient was advised to call back or seek an in-person evaluation if any symptoms worsen or if the condition fails to improve as anticipated.   Further disposition pending results of labs. Discussed med's effects and SE's.    I discussed the assessment and treatment plan with the patient. The patient was provided an opportunity to ask questions and all were answered. The patient agreed with the plan and demonstrated an understanding of the instructions.  Discussed med's effects and SE's.  Screening labs and tests as requested with regular follow-up as recommended.  I provided 40 minutes of face-to-face time during this encounter including counseling, chart review, and critical decision making was preformed.  Today's Plan of Care is based on a patient-centered health care approach known as shared decision making - the decisions, tests and treatments allow for patient preferences and values to be balanced with clinical evidence.     Future Appointments  Date Time Provider Department Center  06/04/2024 10:00 AM Adela Glimpse, NP [facility] None     HPI  62 y.o. female  presents for a complete physical. She has Allergy; Hyperlipidemia; Arthritis; Migraines; History of prediabetes; BMI less than 19,adult; Vitamin D deficiency; and Closed fracture of proximal end of right humerus on their problem list.   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  Widowed, no children.   Overall she reports feeling well today.   She has noticed increase in sore throat that has decreased in soreness today.  She has no other associated symptoms.  Has not been taking OTC antihistamine.  Zyrtec has been effective in the past.  Does report PND.  She does note increase feeling of water in her ears since starting water aerobics.    Broke right humerus in 02/2022 while playing pickle ball.  Had right proximal humerus fracture.  Completed PT scheduled for twice a week for 4 weeks.  She is following with [facility] and is attending water aerobics weekly Pain is managed with Tylenol and IBU.  Her range of motion has progressed since receiving an injection, but remains quite limited with firm and feels in all planes.   States the was recently screened outside of practice for a bone density test that was shown she was osteopenic with a T-Score of -1.8.  She has a recent break of right humerus.   She has a history of migraines, was seeing Dr. Daisy Blossom for headaches for botox but we  started nurtec PRN with significant improvement, however no longer taking stating insurance will not cover. HA have increased to over 4 days/month. Takes maxalt PRN without benefit.  Has failed triptans in the past.    BMI is Body mass index is 19.75 kg/m., she is monitoring diet and exercise. Wt Readings from Last 3 Encounters:  06/05/23 112 lb 6.4 oz (51 kg)  10/13/22 108 lb (49 kg)  05/23/22 108 lb 9.6 oz (49.3 kg)    Her blood pressure has been controlled at home, today their BP is BP: 110/72 She does not workout. She denies chest pain, shortness of breath, dizziness.   She is on cholesterol medication, zetia every day and denies myalgias. Her cholesterol is at goal. The cholesterol last visit was:   Lab Results  Component Value Date   CHOL 211 (H) 05/23/2022   HDL 66 05/23/2022   LDLCALC 125 (H) 05/23/2022   TRIG 98 05/23/2022   CHOLHDL 3.2 05/23/2022    Last A1C in the office was:  Lab Results  Component Value Date   HGBA1C 5.5 05/23/2022  .   Patient is on Vitamin D supplement, she is on 2000 IU daily Lab Results  Component Value Date   VD25OH 95 05/23/2022    She reports is taking a b12 suplement, has reduced dose since last check  Lab Results  Component Value Date   VITAMINB12 1,581 (H) 05/06/2020      Current Medications:  Current Outpatient Medications  on File Prior to Visit  Medication Sig Dispense Refill   ezetimibe (ZETIA) 10 MG tablet TAKE 1 TABLET DAILY FOR CHOLESTEROL / PATIENT KNOWS TO TAKE BY MOUTH 90 tablet 3   ibuprofen (ADVIL) 800 MG tablet TAKE 1 TABLET THREE TIMES A DAY WITH FOOD AS NEEDED 90 tablet 0   benzonatate (TESSALON PERLES) 100 MG capsule Take 1 capsule (100 mg total) by mouth 3 (three) times daily as needed for cough. (Patient not taking: Reported on 06/05/2023) 30 capsule 1   Bepotastine Besilate 1.5 % SOLN Place 1 drop into both eyes 2 (two) times daily as needed. (Patient not taking: Reported on 06/05/2023) 10 mL 1   fluticasone (FLONASE)  50 MCG/ACT nasal spray PLACE 1 SPRAY INTO BOTH NOSTRILS DAILY. (Patient not taking: Reported on 06/05/2023) 48 g 3   No current facility-administered medications on file prior to visit.   Health Maintenance:   Immunization History  Administered Date(s) Administered   Influenza Split 08/28/2013, 08/28/2014, 07/29/2015   Influenza,inj,Quad PF,6+ Mos 08/18/2022   Influenza-Unspecified 07/31/2018   Moderna SARS-COV2 Booster Vaccination 10/16/2020   Moderna Sars-Covid-2 Vaccination 12/23/2019, 01/21/2020   Td 07/12/2010   Tdap 07/29/2015   Tetanus: 2016 Pneumovax: N/A Prevnar 13: due 63 Flu vaccine: 07/2022 Shingrix: - due Covid 19: 2/2, 2021, moderna + booster  Pap: 10/2020 [facility]  MGM: 08/08/2022 DEXA: no family history, on estrogen, Vitamin D due age 82, had pre-screening which revealed osteopenia with a T Score -1.8. Colonoscopy: declines colonoscopy Cologuard: 07/2021 negative - 3 yr recall EGD:  Last Dental Exam: Dr. Alla German, 2024, goes q12m Last Eye Exam: Dr. Lisbeth Renshaw at [facility], 2024, wears glasses DERM- [facility], last visit 2021  Patient Care Team: Lucky Cowboy, MD as PCP - General (Internal Medicine) Anson Fret, MD as Consulting Physician (Neurology)  Allergies: No Known Allergies Medical History:  Past Medical History:  Diagnosis Date   Allergy    Arthritis    Hyperlipidemia    Migraines    Surgical History:  Past Surgical History:  Procedure Laterality Date   LAPAROSCOPIC ABDOMINAL EXPLORATION  1990   Family History:  Patient's  family history includes Alzheimer's disease in her mother; Diabetes in her maternal uncle, paternal aunt, and paternal grandmother; Heart attack in her paternal uncle; Hyperlipidemia in her father and mother; Hypertension in her father and mother. Social History:  She  reports that she has never smoked. She has never used smokeless tobacco. She reports current alcohol use. She reports that she does not  use drugs.  Review of Systems  Constitutional:  Negative for chills, fever, malaise/fatigue and weight loss.  HENT:  Negative for congestion, ear pain, hearing loss, sore throat and tinnitus.   Eyes: Negative.  Negative for blurred vision and double vision.  Respiratory:  Negative for cough, sputum production, shortness of breath and wheezing.   Cardiovascular:  Negative for chest pain, palpitations, orthopnea, claudication, leg swelling and PND.  Gastrointestinal:  Negative for abdominal pain, blood in stool, constipation, diarrhea, heartburn, melena, nausea and vomiting.  Genitourinary: Negative.   Musculoskeletal:  Positive for joint pain (right shoulder pain, LROM). Negative for falls and myalgias.  Skin:  Negative for rash.  Neurological:  Positive for headaches. Negative for dizziness, tingling, sensory change, loss of consciousness and weakness.  Endo/Heme/Allergies:  Negative for polydipsia.  Psychiatric/Behavioral: Negative.  Negative for depression, memory loss, substance abuse and suicidal ideas. The patient is not nervous/anxious and does not have insomnia.   All other systems reviewed  and are negative.   Physical Exam: Estimated body mass index is 19.75 kg/m as calculated from the following:   Height as of this encounter: 5' 3.25" (1.607 m).   Weight as of this encounter: 112 lb 6.4 oz (51 kg). BP 110/72   Pulse 86   Temp 98.2 F (36.8 C)   Ht 5' 3.25" (1.607 m)   Wt 112 lb 6.4 oz (51 kg)   SpO2 99%   BMI 19.75 kg/m  General Appearance: Thin adult, in no apparent distress.  Eyes: PERRLA, EOMs, conjunctiva no swelling or erythema Sinuses: No Frontal/maxillary tenderness  ENT/Mouth: Ext aud canals clear, normal light reflex with TMs without erythema, bulging. Good dentition. No erythema, swelling, or exudate on post pharynx. Tonsils not swollen or erythematous. Hearing normal. Neck: Supple, thyroid normal. No bruits  Respiratory: Respiratory effort normal, BS equal  bilaterally without rales, rhonchi, wheezing or stridor.  Cardio: RRR without murmurs, rubs or gallops. Brisk peripheral pulses without edema.  Chest: symmetric, with normal excursions and percussion.  Breasts: Declines manual exam today  Abdomen: Firm throughout/patient unable to relax, nontender, no guarding, rebound, palpable hernias, masses, or organomegaly. .  Lymphatics: Non tender without lymphadenopathy.  Genitourinary: Deferred to GYN, no concerns Musculoskeletal: LROM in RUE  Skin: Warm, dry without rashes, lesions, ecchymosis. Neuro: Cranial nerves intact, reflexes equal bilaterally. Normal muscle tone, no cerebellar symptoms. Sensation intact.  Psych: Awake and oriented X 3, normal affect, Insight and Judgment appropriate.   EKG:  WNL, NSCPT, no ST changes   Heidi Stone 12:15 PM [facility]

## 2023-06-06 ENCOUNTER — Encounter: Payer: Self-pay | Admitting: Nurse Practitioner

## 2023-06-12 ENCOUNTER — Encounter: Payer: Self-pay | Admitting: Nurse Practitioner

## 2023-06-12 DIAGNOSIS — J069 Acute upper respiratory infection, unspecified: Secondary | ICD-10-CM

## 2023-06-12 MED ORDER — AZITHROMYCIN 250 MG PO TABS: ORAL_TABLET | ORAL | 1 refills | Status: DC

## 2023-06-28 ENCOUNTER — Other Ambulatory Visit: Payer: Self-pay | Admitting: Nurse Practitioner

## 2023-06-28 DIAGNOSIS — Z1231 Encounter for screening mammogram for malignant neoplasm of breast: Secondary | ICD-10-CM

## 2023-08-01 ENCOUNTER — Other Ambulatory Visit: Payer: Self-pay | Admitting: Nurse Practitioner

## 2023-08-01 DIAGNOSIS — E785 Hyperlipidemia, unspecified: Secondary | ICD-10-CM

## 2023-08-01 DIAGNOSIS — Z0001 Encounter for general adult medical examination with abnormal findings: Secondary | ICD-10-CM

## 2023-08-10 ENCOUNTER — Ambulatory Visit
Admission: RE | Admit: 2023-08-10 | Discharge: 2023-08-10 | Disposition: A | Discharge status: Home / Self Care | Payer: 59 | Source: Ambulatory Visit | Attending: Nurse Practitioner | Admitting: Nurse Practitioner

## 2023-08-10 DIAGNOSIS — Z1231 Encounter for screening mammogram for malignant neoplasm of breast: Secondary | ICD-10-CM | POA: Diagnosis not present

## 2023-08-21 ENCOUNTER — Encounter: Payer: Self-pay | Admitting: Internal Medicine

## 2023-08-21 NOTE — Progress Notes (Signed)
     Future Appointments  Date Time Provider Department  08/22/2023 10:30 AM Lucky Cowboy, MD [facility]  09/05/2023 11:00 AM Adela Glimpse, NP [facility]  06/04/2024 10:00 AM Adela Glimpse, NP [facility]    History of Present Illness:      Patient is a very nice 62 yo DWF with long hx/o chronic HAs , HLD, Vit D Deficiency  presenting  with c/o pain of her Lt hand and associated redness . Denies hx/o injury /trauma.      She also c/o nasal stuffiness w/o sinus congestion.     Current Outpatient Medications on File Prior to Visit  Medication Sig   ezetimibe  10 MG tablet TAKE 1 TABLET DAILY    ibuprofen  800 MG tablet TAKE 1 TABLET THREE TIMES A DAY WITH FOOD AS NEEDED   Rimegepant Sulfate (NURTEC) 75 MG TBDP DISSOLVE 1 TABLET ONCE PER DAY AS NEEDED WITH ONSET OF MIGRAINE FOR HEADACHE    No Known Allergies   Problem list She has Allergy; Hyperlipidemia; Arthritis; Migraines; History of prediabetes; BMI less than 19,adult; Vitamin D deficiency; and Closed fracture of proximal end of right humerus on their problem list.   Observations/Objective:  BP 110/72   Pulse 96   Temp 97.9 F (36.6 C)   Resp 16   Ht 5' 3.25" (1.607 m)   Wt 111 lb 3.2 oz (50.4 kg)   SpO2 99%   BMI 19.54 kg/m    HEENT - EACs /TMs - Nl. Nasal turbinates boggy w/o exudates.                  No Sinus tenderness. O/P clear.  Neck - supple.  Chest - Clear equal BS. Cor - Nl HS. RRR w/o sig MGR. PP 1(+). No edema. MS- FROM w/o deformities.  Gait Nl. Neuro -  Nl w/o focal abnormalities. Skin - there is a tender  erythematous 5x5 mm sl raised circumscribed lesion of the Left palm base of the hypothenar area. After informed consent & aseptic prep with alcohol the area was injected with 1/2 ml of Bupivacaine 0.5%  & 1/2 ml Decadron  (5 mg)   Assessment and Plan:   1. Seasonal allergic rhinitis due to pollen  - FLONASE  nasal spray;  Use 1 to 2 sprays each nostril 2 to 3 x / day   Dispense: 48  g; Refill: 3   2. Skin lesion of hand   Follow Up Instructions:        I discussed the assessment and treatment plan with the patient. The patient was provided an opportunity to ask questions and all were answered. The patient agreed with the plan and demonstrated an understanding of the instructions.       The patient was advised to call back or seek an in-person evaluation if the symptoms worsen or if the condition fails to improve as anticipated.    Marinus Maw, MD

## 2023-08-22 ENCOUNTER — Ambulatory Visit: Payer: 59 | Admitting: Internal Medicine

## 2023-08-22 ENCOUNTER — Encounter: Payer: Self-pay | Admitting: Internal Medicine

## 2023-08-22 VITALS — BP 110/72 | HR 96 | Temp 97.9°F | Resp 16 | Ht 63.25 in | Wt 111.2 lb

## 2023-08-22 DIAGNOSIS — J301 Allergic rhinitis due to pollen: Secondary | ICD-10-CM | POA: Diagnosis not present

## 2023-08-22 DIAGNOSIS — L989 Disorder of the skin and subcutaneous tissue, unspecified: Secondary | ICD-10-CM

## 2023-08-22 MED ORDER — FLUTICASONE PROPIONATE 50 MCG/ACT NA SUSP
NASAL | 3 refills | Status: AC
Start: 2023-08-22 — End: ?

## 2023-09-05 ENCOUNTER — Ambulatory Visit: Payer: 59 | Admitting: Nurse Practitioner

## 2023-09-18 DIAGNOSIS — Z23 Encounter for immunization: Secondary | ICD-10-CM | POA: Diagnosis not present

## 2023-09-18 DIAGNOSIS — W19XXXA Unspecified fall, initial encounter: Secondary | ICD-10-CM | POA: Diagnosis not present

## 2023-09-18 DIAGNOSIS — S0181XA Laceration without foreign body of other part of head, initial encounter: Secondary | ICD-10-CM | POA: Diagnosis not present

## 2023-10-03 ENCOUNTER — Other Ambulatory Visit: Payer: Self-pay | Admitting: Nurse Practitioner

## 2023-10-03 DIAGNOSIS — G43009 Migraine without aura, not intractable, without status migrainosus: Secondary | ICD-10-CM

## 2023-10-03 NOTE — Telephone Encounter (Signed)
I've never seen this patient she sees [place]

## 2023-11-15 DIAGNOSIS — L280 Lichen simplex chronicus: Secondary | ICD-10-CM | POA: Diagnosis not present

## 2023-11-15 DIAGNOSIS — L905 Scar conditions and fibrosis of skin: Secondary | ICD-10-CM | POA: Diagnosis not present

## 2023-11-15 DIAGNOSIS — L814 Other melanin hyperpigmentation: Secondary | ICD-10-CM | POA: Diagnosis not present

## 2023-11-15 DIAGNOSIS — L438 Other lichen planus: Secondary | ICD-10-CM | POA: Diagnosis not present

## 2023-11-30 ENCOUNTER — Encounter: Payer: Self-pay | Admitting: Nurse Practitioner

## 2023-12-05 ENCOUNTER — Telehealth: Payer: Self-pay

## 2023-12-05 NOTE — Telephone Encounter (Signed)
PA completed for Nurtec

## 2023-12-07 DIAGNOSIS — H5213 Myopia, bilateral: Secondary | ICD-10-CM | POA: Diagnosis not present

## 2023-12-19 DIAGNOSIS — K080 Exfoliation of teeth due to systemic causes: Secondary | ICD-10-CM | POA: Diagnosis not present

## 2023-12-25 DIAGNOSIS — K080 Exfoliation of teeth due to systemic causes: Secondary | ICD-10-CM | POA: Diagnosis not present

## 2024-01-01 ENCOUNTER — Telehealth: Payer: Self-pay | Admitting: *Deleted

## 2024-01-01 NOTE — Telephone Encounter (Signed)
 Copied from CRM [id number]. Topic: Clinical - Request for Lab/Test Order >> Jan 01, 2024  1:22 PM Heidi Stone wrote: Reason for CRM: Patient wants to know if an order can be called in for a bone density test. She stated she is schedule for one this friday but was cancelled since her doctor's office is closed. She stated that they can schedule her an appointment for this Monday if New doctor sends over an order. *If this can be approved please notify patient so they won't cancel her appointment

## 2024-01-05 ENCOUNTER — Other Ambulatory Visit: Payer: 59

## 2024-01-16 DIAGNOSIS — G43909 Migraine, unspecified, not intractable, without status migrainosus: Secondary | ICD-10-CM | POA: Diagnosis not present

## 2024-01-16 DIAGNOSIS — R7303 Prediabetes: Secondary | ICD-10-CM | POA: Diagnosis not present

## 2024-01-16 DIAGNOSIS — E559 Vitamin D deficiency, unspecified: Secondary | ICD-10-CM | POA: Diagnosis not present

## 2024-01-16 DIAGNOSIS — E7800 Pure hypercholesterolemia, unspecified: Secondary | ICD-10-CM | POA: Diagnosis not present

## 2024-01-16 DIAGNOSIS — M25562 Pain in left knee: Secondary | ICD-10-CM | POA: Diagnosis not present

## 2024-01-17 ENCOUNTER — Other Ambulatory Visit: Payer: Self-pay | Admitting: Family Medicine

## 2024-01-17 DIAGNOSIS — E2839 Other primary ovarian failure: Secondary | ICD-10-CM

## 2024-01-19 DIAGNOSIS — K080 Exfoliation of teeth due to systemic causes: Secondary | ICD-10-CM | POA: Diagnosis not present

## 2024-01-24 ENCOUNTER — Ambulatory Visit
Admission: RE | Admit: 2024-01-24 | Discharge: 2024-01-24 | Disposition: A | Discharge status: Home / Self Care | Payer: Self-pay | Source: Ambulatory Visit | Attending: Family Medicine | Admitting: Family Medicine

## 2024-01-24 DIAGNOSIS — N958 Other specified menopausal and perimenopausal disorders: Secondary | ICD-10-CM | POA: Diagnosis not present

## 2024-01-24 DIAGNOSIS — M8588 Other specified disorders of bone density and structure, other site: Secondary | ICD-10-CM | POA: Diagnosis not present

## 2024-01-24 DIAGNOSIS — E2839 Other primary ovarian failure: Secondary | ICD-10-CM | POA: Diagnosis not present

## 2024-02-05 ENCOUNTER — Ambulatory Visit: Payer: Self-pay | Admitting: Family Medicine

## 2024-02-19 DIAGNOSIS — M25562 Pain in left knee: Secondary | ICD-10-CM | POA: Diagnosis not present

## 2024-02-19 DIAGNOSIS — M25561 Pain in right knee: Secondary | ICD-10-CM | POA: Diagnosis not present

## 2024-02-20 DIAGNOSIS — M810 Age-related osteoporosis without current pathological fracture: Secondary | ICD-10-CM | POA: Diagnosis not present

## 2024-03-07 DIAGNOSIS — L820 Inflamed seborrheic keratosis: Secondary | ICD-10-CM | POA: Diagnosis not present

## 2024-03-07 DIAGNOSIS — D485 Neoplasm of uncertain behavior of skin: Secondary | ICD-10-CM | POA: Diagnosis not present

## 2024-03-07 DIAGNOSIS — L570 Actinic keratosis: Secondary | ICD-10-CM | POA: Diagnosis not present

## 2024-03-22 DIAGNOSIS — E559 Vitamin D deficiency, unspecified: Secondary | ICD-10-CM | POA: Diagnosis not present

## 2024-03-22 DIAGNOSIS — Z87442 Personal history of urinary calculi: Secondary | ICD-10-CM | POA: Diagnosis not present

## 2024-03-22 DIAGNOSIS — R001 Bradycardia, unspecified: Secondary | ICD-10-CM | POA: Diagnosis not present

## 2024-03-22 DIAGNOSIS — M810 Age-related osteoporosis without current pathological fracture: Secondary | ICD-10-CM | POA: Diagnosis not present

## 2024-04-23 DIAGNOSIS — K080 Exfoliation of teeth due to systemic causes: Secondary | ICD-10-CM | POA: Diagnosis not present

## 2024-04-24 DIAGNOSIS — K080 Exfoliation of teeth due to systemic causes: Secondary | ICD-10-CM | POA: Diagnosis not present

## 2024-05-23 ENCOUNTER — Encounter: Payer: 59 | Admitting: Nurse Practitioner

## 2024-06-04 ENCOUNTER — Encounter: Payer: 59 | Admitting: Nurse Practitioner

## 2024-06-07 DIAGNOSIS — Z0000 Encounter for general adult medical examination without abnormal findings: Secondary | ICD-10-CM | POA: Diagnosis not present

## 2024-06-07 DIAGNOSIS — E7800 Pure hypercholesterolemia, unspecified: Secondary | ICD-10-CM | POA: Diagnosis not present

## 2024-06-07 DIAGNOSIS — E559 Vitamin D deficiency, unspecified: Secondary | ICD-10-CM | POA: Diagnosis not present

## 2024-06-07 DIAGNOSIS — Z23 Encounter for immunization: Secondary | ICD-10-CM | POA: Diagnosis not present

## 2024-06-07 DIAGNOSIS — R7303 Prediabetes: Secondary | ICD-10-CM | POA: Diagnosis not present

## 2024-06-07 DIAGNOSIS — M810 Age-related osteoporosis without current pathological fracture: Secondary | ICD-10-CM | POA: Diagnosis not present

## 2024-06-20 DIAGNOSIS — W57XXXA Bitten or stung by nonvenomous insect and other nonvenomous arthropods, initial encounter: Secondary | ICD-10-CM | POA: Diagnosis not present

## 2024-06-20 DIAGNOSIS — S70362A Insect bite (nonvenomous), left thigh, initial encounter: Secondary | ICD-10-CM | POA: Diagnosis not present

## 2024-06-26 DIAGNOSIS — R7303 Prediabetes: Secondary | ICD-10-CM | POA: Diagnosis not present

## 2024-06-26 DIAGNOSIS — E7800 Pure hypercholesterolemia, unspecified: Secondary | ICD-10-CM | POA: Diagnosis not present

## 2024-07-09 ENCOUNTER — Other Ambulatory Visit: Payer: Self-pay | Admitting: Family Medicine

## 2024-07-09 DIAGNOSIS — Z1231 Encounter for screening mammogram for malignant neoplasm of breast: Secondary | ICD-10-CM

## 2024-07-15 DIAGNOSIS — M810 Age-related osteoporosis without current pathological fracture: Secondary | ICD-10-CM | POA: Diagnosis not present

## 2024-07-16 DIAGNOSIS — K080 Exfoliation of teeth due to systemic causes: Secondary | ICD-10-CM | POA: Diagnosis not present

## 2024-08-12 ENCOUNTER — Ambulatory Visit

## 2024-09-02 DIAGNOSIS — Z1212 Encounter for screening for malignant neoplasm of rectum: Secondary | ICD-10-CM | POA: Diagnosis not present

## 2024-09-02 DIAGNOSIS — Z1211 Encounter for screening for malignant neoplasm of colon: Secondary | ICD-10-CM | POA: Diagnosis not present

## 2024-09-06 ENCOUNTER — Ambulatory Visit
Admission: RE | Admit: 2024-09-06 | Discharge: 2024-09-06 | Disposition: A | Discharge status: Home / Self Care | Source: Ambulatory Visit | Attending: Family Medicine | Admitting: Family Medicine

## 2024-09-06 DIAGNOSIS — Z1231 Encounter for screening mammogram for malignant neoplasm of breast: Secondary | ICD-10-CM

## 2024-09-08 LAB — COLOGUARD: COLOGUARD: NEGATIVE

## 2024-09-23 DIAGNOSIS — M810 Age-related osteoporosis without current pathological fracture: Secondary | ICD-10-CM | POA: Diagnosis not present

## 2024-10-11 DIAGNOSIS — E785 Hyperlipidemia, unspecified: Secondary | ICD-10-CM | POA: Diagnosis not present
